# Patient Record
Sex: Female | Born: 1945
Health system: Southern US, Community
[De-identification: ages and names within clinical notes are randomized; demographics above are authoritative.]

## PROBLEM LIST (undated history)

## (undated) DIAGNOSIS — G4700 Insomnia, unspecified: Secondary | ICD-10-CM

## (undated) DIAGNOSIS — E0590 Thyrotoxicosis, unspecified without thyrotoxic crisis or storm: Secondary | ICD-10-CM

## (undated) DIAGNOSIS — F3289 Other specified depressive episodes: Secondary | ICD-10-CM

## (undated) DIAGNOSIS — G4730 Sleep apnea, unspecified: Secondary | ICD-10-CM

## (undated) DIAGNOSIS — T7840XA Allergy, unspecified, initial encounter: Secondary | ICD-10-CM

## (undated) DIAGNOSIS — D649 Anemia, unspecified: Secondary | ICD-10-CM

## (undated) DIAGNOSIS — E039 Hypothyroidism, unspecified: Secondary | ICD-10-CM

## (undated) DIAGNOSIS — M47814 Spondylosis without myelopathy or radiculopathy, thoracic region: Secondary | ICD-10-CM

## (undated) DIAGNOSIS — N3020 Other chronic cystitis without hematuria: Secondary | ICD-10-CM

## (undated) DIAGNOSIS — F410 Panic disorder [episodic paroxysmal anxiety] without agoraphobia: Secondary | ICD-10-CM

## (undated) DIAGNOSIS — I499 Cardiac arrhythmia, unspecified: Secondary | ICD-10-CM

## (undated) DIAGNOSIS — T8859XA Other complications of anesthesia, initial encounter: Secondary | ICD-10-CM

## (undated) DIAGNOSIS — F0390 Unspecified dementia without behavioral disturbance: Secondary | ICD-10-CM

## (undated) DIAGNOSIS — M1990 Unspecified osteoarthritis, unspecified site: Secondary | ICD-10-CM

## (undated) DIAGNOSIS — K219 Gastro-esophageal reflux disease without esophagitis: Secondary | ICD-10-CM

## (undated) DIAGNOSIS — F909 Attention-deficit hyperactivity disorder, unspecified type: Secondary | ICD-10-CM

## (undated) DIAGNOSIS — M26609 Unspecified temporomandibular joint disorder, unspecified side: Secondary | ICD-10-CM

## (undated) DIAGNOSIS — G709 Myoneural disorder, unspecified: Secondary | ICD-10-CM

## (undated) DIAGNOSIS — G4733 Obstructive sleep apnea (adult) (pediatric): Secondary | ICD-10-CM

## (undated) DIAGNOSIS — N200 Calculus of kidney: Secondary | ICD-10-CM

## (undated) DIAGNOSIS — E785 Hyperlipidemia, unspecified: Secondary | ICD-10-CM

## (undated) DIAGNOSIS — E119 Type 2 diabetes mellitus without complications: Secondary | ICD-10-CM

## (undated) DIAGNOSIS — T4145XA Adverse effect of unspecified anesthetic, initial encounter: Secondary | ICD-10-CM

## (undated) DIAGNOSIS — K222 Esophageal obstruction: Secondary | ICD-10-CM

## (undated) DIAGNOSIS — F4310 Post-traumatic stress disorder, unspecified: Secondary | ICD-10-CM

## (undated) DIAGNOSIS — F329 Major depressive disorder, single episode, unspecified: Secondary | ICD-10-CM

## (undated) DIAGNOSIS — N289 Disorder of kidney and ureter, unspecified: Secondary | ICD-10-CM

## (undated) HISTORY — DX: Major depressive disorder, single episode, unspecified: F32.9

## (undated) HISTORY — DX: Unspecified osteoarthritis, unspecified site: M19.90

## (undated) HISTORY — PX: TUBAL LIGATION: SHX77

## (undated) HISTORY — PX: CHOLECYSTECTOMY: SHX55

## (undated) HISTORY — DX: Attention-deficit hyperactivity disorder, unspecified type: F90.9

## (undated) HISTORY — DX: Post-traumatic stress disorder, unspecified: F43.10

## (undated) HISTORY — PX: TONSILLECTOMY: SUR1361

## (undated) HISTORY — PX: EYE SURGERY: SHX253

## (undated) HISTORY — DX: Hypothyroidism, unspecified: E03.9

## (undated) HISTORY — PX: APPENDECTOMY: SHX54

## (undated) HISTORY — DX: Myoneural disorder, unspecified: G70.9

## (undated) HISTORY — DX: Anemia, unspecified: D64.9

## (undated) HISTORY — DX: Spondylosis without myelopathy or radiculopathy, thoracic region: M47.814

## (undated) HISTORY — DX: Esophageal obstruction: K22.2

## (undated) HISTORY — DX: Other specified depressive episodes: F32.89

## (undated) HISTORY — PX: ABDOMINAL HYSTERECTOMY: SHX81

## (undated) HISTORY — DX: Insomnia, unspecified: G47.00

## (undated) HISTORY — DX: Panic disorder (episodic paroxysmal anxiety): F41.0

## (undated) HISTORY — DX: Other chronic cystitis without hematuria: N30.20

## (undated) HISTORY — DX: Thyrotoxicosis, unspecified without thyrotoxic crisis or storm: E05.90

## (undated) HISTORY — DX: Type 2 diabetes mellitus without complications: E11.9

## (undated) HISTORY — DX: Hyperlipidemia, unspecified: E78.5

## (undated) HISTORY — DX: Gastro-esophageal reflux disease without esophagitis: K21.9

## (undated) HISTORY — DX: Unspecified temporomandibular joint disorder, unspecified side: M26.609

## (undated) HISTORY — PX: COLONOSCOPY: SHX5424

## (undated) HISTORY — DX: Sleep apnea, unspecified: G47.30

## (undated) HISTORY — DX: Obstructive sleep apnea (adult) (pediatric): G47.33

## (undated) HISTORY — PX: CYSTORRHAPHY: SUR367

## (undated) HISTORY — DX: Allergy, unspecified, initial encounter: T78.40XA

## (undated) HISTORY — DX: Disorder of kidney and ureter, unspecified: N28.9

---

## 1997-05-26 ENCOUNTER — Ambulatory Visit (HOSPITAL_COMMUNITY): Admission: RE | Admit: 1997-05-26 | Discharge: 1997-05-26 | Payer: Self-pay | Admitting: Obstetrics & Gynecology

## 1997-12-01 ENCOUNTER — Ambulatory Visit (HOSPITAL_COMMUNITY): Admission: RE | Admit: 1997-12-01 | Discharge: 1997-12-01 | Payer: Self-pay | Admitting: Internal Medicine

## 1998-10-25 ENCOUNTER — Encounter: Admission: RE | Admit: 1998-10-25 | Discharge: 1998-11-03 | Payer: Self-pay | Admitting: *Deleted

## 1999-02-01 ENCOUNTER — Inpatient Hospital Stay (HOSPITAL_COMMUNITY): Admission: EM | Admit: 1999-02-01 | Discharge: 1999-02-07 | Payer: Self-pay | Admitting: *Deleted

## 1999-03-08 ENCOUNTER — Emergency Department (HOSPITAL_COMMUNITY): Admission: EM | Admit: 1999-03-08 | Discharge: 1999-03-08 | Payer: Self-pay | Admitting: Emergency Medicine

## 1999-10-22 ENCOUNTER — Encounter: Payer: Self-pay | Admitting: Obstetrics & Gynecology

## 1999-10-22 ENCOUNTER — Ambulatory Visit (HOSPITAL_COMMUNITY): Admission: RE | Admit: 1999-10-22 | Discharge: 1999-10-22 | Payer: Self-pay | Admitting: Obstetrics & Gynecology

## 1999-12-04 ENCOUNTER — Inpatient Hospital Stay (HOSPITAL_COMMUNITY): Admission: EM | Admit: 1999-12-04 | Discharge: 1999-12-11 | Payer: Self-pay | Admitting: Psychiatry

## 2000-06-06 ENCOUNTER — Encounter (INDEPENDENT_AMBULATORY_CARE_PROVIDER_SITE_OTHER): Payer: Self-pay

## 2000-06-06 ENCOUNTER — Other Ambulatory Visit: Admission: RE | Admit: 2000-06-06 | Discharge: 2000-06-06 | Payer: Self-pay | Admitting: Obstetrics & Gynecology

## 2001-08-03 ENCOUNTER — Other Ambulatory Visit: Admission: RE | Admit: 2001-08-03 | Discharge: 2001-08-03 | Payer: Self-pay | Admitting: Obstetrics & Gynecology

## 2001-10-21 ENCOUNTER — Encounter: Payer: Self-pay | Admitting: Family Medicine

## 2001-10-21 ENCOUNTER — Encounter: Admission: RE | Admit: 2001-10-21 | Discharge: 2001-10-21 | Payer: Self-pay | Admitting: Family Medicine

## 2001-11-23 ENCOUNTER — Ambulatory Visit (HOSPITAL_COMMUNITY): Admission: RE | Admit: 2001-11-23 | Discharge: 2001-11-23 | Payer: Self-pay | Admitting: Neurology

## 2001-11-23 ENCOUNTER — Encounter: Payer: Self-pay | Admitting: Neurology

## 2006-01-15 ENCOUNTER — Ambulatory Visit: Payer: Self-pay | Admitting: Internal Medicine

## 2006-01-23 ENCOUNTER — Ambulatory Visit: Payer: Self-pay | Admitting: Internal Medicine

## 2006-01-23 ENCOUNTER — Encounter (INDEPENDENT_AMBULATORY_CARE_PROVIDER_SITE_OTHER): Payer: Self-pay | Admitting: Specialist

## 2006-01-30 ENCOUNTER — Ambulatory Visit (HOSPITAL_COMMUNITY): Admission: RE | Admit: 2006-01-30 | Discharge: 2006-01-30 | Payer: Self-pay | Admitting: Internal Medicine

## 2006-01-30 ENCOUNTER — Encounter: Payer: Self-pay | Admitting: Internal Medicine

## 2007-04-27 ENCOUNTER — Ambulatory Visit: Payer: Self-pay | Admitting: Internal Medicine

## 2007-06-04 ENCOUNTER — Encounter: Payer: Self-pay | Admitting: Internal Medicine

## 2007-06-04 ENCOUNTER — Ambulatory Visit: Payer: Self-pay | Admitting: Internal Medicine

## 2008-11-22 DIAGNOSIS — F3289 Other specified depressive episodes: Secondary | ICD-10-CM | POA: Insufficient documentation

## 2008-11-22 DIAGNOSIS — K2990 Gastroduodenitis, unspecified, without bleeding: Secondary | ICD-10-CM

## 2008-11-22 DIAGNOSIS — F329 Major depressive disorder, single episode, unspecified: Secondary | ICD-10-CM | POA: Insufficient documentation

## 2008-11-22 DIAGNOSIS — K2970 Gastritis, unspecified, without bleeding: Secondary | ICD-10-CM | POA: Insufficient documentation

## 2008-11-22 DIAGNOSIS — Z8719 Personal history of other diseases of the digestive system: Secondary | ICD-10-CM | POA: Insufficient documentation

## 2008-11-22 DIAGNOSIS — K5289 Other specified noninfective gastroenteritis and colitis: Secondary | ICD-10-CM | POA: Insufficient documentation

## 2008-11-22 DIAGNOSIS — E039 Hypothyroidism, unspecified: Secondary | ICD-10-CM | POA: Insufficient documentation

## 2008-11-22 DIAGNOSIS — N39498 Other specified urinary incontinence: Secondary | ICD-10-CM | POA: Insufficient documentation

## 2008-11-22 DIAGNOSIS — R159 Full incontinence of feces: Secondary | ICD-10-CM | POA: Insufficient documentation

## 2009-05-03 ENCOUNTER — Telehealth: Payer: Self-pay | Admitting: Internal Medicine

## 2010-03-19 ENCOUNTER — Ambulatory Visit: Payer: Self-pay | Admitting: Internal Medicine

## 2010-04-28 ENCOUNTER — Emergency Department (HOSPITAL_BASED_OUTPATIENT_CLINIC_OR_DEPARTMENT_OTHER)
Admission: EM | Admit: 2010-04-28 | Discharge: 2010-04-29 | Payer: Self-pay | Source: Home / Self Care | Admitting: Emergency Medicine

## 2010-05-08 NOTE — Procedures (Signed)
Summary: COLON   Colonoscopy  Procedure date:  06/04/2007  Findings:      Location:  [facility].    Procedures Next Due Date:    Colonoscopy: 06/2017 Patient Name: Brianna, [name]. MRN:  Procedure Procedures: Colonoscopy CPT: [phone].    with biopsy. CPT: Q5068410.  Personnel: Endoscopist: Dora L. [name], MD.  Exam Location: Exam performed in Outpatient Clinic. Outpatient  Patient Consent: Procedure, Alternatives, Risks and Benefits discussed, consent obtained, from patient. Consent was obtained by the RN.  Indications Symptoms: Diarrhea Patient is incontinent of stool.  Surveillance of: 2003.  History  Current Medications: Patient is not currently taking Coumadin.  Pre-Exam Physical: Performed Jun 04, 2007. Cardio-pulmonary exam, HEENT exam , Abdominal exam, Extremity exam, Neurological exam, Mental status exam WNL.  Comments: Pt. history reviewed/updated, physical exam performed prior to initiation of sedation?yes Exam Exam: Extent of exam reached: Cecum, extent intended: Cecum.  The cecum was identified by appendiceal orifice and IC valve. Duration of exam: time in 7:28 m in, out 5:51 min minutes. Colon retroflexion performed. Images taken. ASA Classification: I. Tolerance: good.  Monitoring: Pulse and BP monitoring, Oximetry used. Supplemental O2 given.  Colon Prep Used Miralax for colon prep. Prep results: good.  Sedation Meds: Patient assessed and found to be appropriate for moderate (conscious) sedation. Fentanyl 75 mcg. given IV. Versed 9 mg. given IV. Benadryl 25 given IV.  Findings - DIAGNOSTIC TEST: Biopsies taken. from Hepatic Flexure to Descending Colon. Reason: r/o microscopic colitis.  - NORMAL EXAM: Cecum.  - OTHER FINDING: Rectum. Comments: markedly decrease rectal sphincter tone.   Assessment Normal examination.  Comments: no polyps or colitis, s/p random biopsies, incontinence likely due to  oversedation with antidepressants  and poor rectal sphincter tone Events  Unplanned Interventions: No intervention was required.  Unplanned Events: There were no complications. Plans Medication Plan: Await pathology. Anti-diarrheal: Loperamide 2 mg prn, starting Jun 04, 2007  Fiber supplements: Psyllium 1 Tbsp QD, starting Jun 04, 2007   Patient Education: Patient given standard instructions for: Patient instructed to get routine colonoscopy every 10 years.  Disposition: After procedure patient sent to recovery. After recovery patient sent home.   This report was created from the original endoscopy report, which was reviewed and signed by the above listed endoscopist.

## 2010-05-08 NOTE — Procedures (Signed)
Summary: EGD   EGD  Procedure date:  01/23/2006  Findings:      Location: [facility]   Patient Name: Brianna [name], Brianna [name]. MRN:  Procedure Procedures: Panendoscopy (EGD) CPT: 43235.    with biopsy(s)/brushing(s). CPT: D1846139.  Personnel: Endoscopist: Dora L. [name], MD.  Exam Location: Exam performed in Outpatient Clinic. Outpatient  Patient Consent: Procedure, Alternatives, Risks and Benefits discussed, consent obtained, from patient. Consent was obtained by the RN.  Indications Symptoms: Dysphagia.  Surveillance of: 2004.  Comments: dysphagia to solids and liquids, pt is on several psychotropic agents History  Current Medications: Patient is not currently taking Coumadin.  Pre-Exam Physical: Performed Jan 23, 2006  Cardio-pulmonary exam, HEENT exam, Abdominal exam, Extremity exam, Neurological exam, Mental status exam WNL.  Comments: Pt. history reviewed/updated, physical exam performed prior to initiation of sedation?yes Exam Exam Info: Maximum depth of insertion Duodenum, intended Duodenum. Vocal cords visualized. Gastric retroflexion performed. Images taken. ASA Classification: I. Tolerance: good.  Sedation Meds: Patient assessed and found to be appropriate for moderate (conscious) sedation. Fentanyl 50 mcg. given IV. Versed 5 mg. given IV. Cetacaine Spray 2 sprays given aerosolized.  Monitoring: BP and pulse monitoring done. Oximetry used. Supplemental O2 given  Findings - Normal: Distal Esophagus. Comments: no evidence of stricture.  - MUCOSAL ABNORMALITY: Body to Antrum. Erythematous mucosa. RUT done, results pending. ICD9: Gastritis, Acute: 535.00. Comment: moderate amount of bile in the stomach.  Normal: Duodenal Apex.   Assessment Abnormal examination, see findings above.  Diagnoses: 535.00: Gastritis, Acute.   Comments: bile reflux gastritis, no evidence of esophageal stricture, cause of the dysphagia probably due to esophageal  hypomotility secondary to  Clonazepam,Ritalin Trozadone and Tramadol Events  Unplanned Intervention: No unplanned interventions were required.  Unplanned Events: There were no complications. Plans Medication(s): Await pathology. PPI: Pantoprazole/Protonix 40 mg QAM, starting Jan 23, 2006   Comments: barium swallow with cineesoophagram to assess motility Disposition: After procedure patient sent to recovery. After recovery patient sent home.    FINAL DIAGNOSIS    ***MICROSCOPIC EXAMINATION AND DIAGNOSIS***    1. STOMACH: BENIGN GASTRIC MUCOSA. NO HELICOBACTER PYLORI,   INTESTINAL METAPLASIA OR ACTIVE INFLAMMATION IDENTIFIED.    2. STOMACH, POLYPS: BENIGN FUNDIC GLAND POLYPS.    COMMENT   1. A Warthin-Starry stain is performed to determine the   possibility of the presence of Helicobacter pylori. The   Warthin-Starry stain is negative for organisms of Helicobacter   pylori. The control(s) stained appropriately.    2. A Warthin-Starry stain is performed to determine the   possibility of the presence of Helicobacter pylori. The   Warthin-Starry stain is negative for organisms of Helicobacter   pylori. The control(s) stained appropriately.   (EAA:mw 01/24/06)    mw   Date Reported: 01/24/2006 Alden Server A. Delila Spence, MD   *** Electronically Signed Out By EAA ***    Clinical information   Dysphagia Gastritis / gastric polyp (jes)    specimen(s) obtained   1: Stomach, biopsy, antrum   2: Stomach, polyp(s)    Gross Description   1. Received in formalin are tan, soft tissue fragments that are   submitted in toto. Number: multiple.   Size: 0.2 to 0.3 cm One block    2. Received in formalin are tan, soft tissue fragments that are   submitted in toto. Number: multiple.   Size: 0.3 to 0.4 cm One block (TA:jes, 01/24/06)    jes/  This report was created from the original endoscopy report, which was  reviewed and signed by the above listed endoscopist.

## 2010-05-08 NOTE — Progress Notes (Signed)
Summary: Triage   Phone Note Call from Patient Call back at [phone]   Caller: Patient Call For: Dr. [name] Reason for Call: Talk to Nurse Summary of Call: Pt said she is "impacted" and Dr. Riley Nearing is suggesting she get a CT Scan Initial call taken by: Karna Christmas,  May 03, 2009 2:47 PM  Follow-up for Phone Call        Pt. c/o stool impaction for 3 monthes. "I have taken all kinds of cleansers and suppositories, everything. I am not getting any relief"  Pt. PCP recommends a GI consult. Pt. will see Willette Cluster NP on 05-04-09 at 1:30pm. Pt. advised of med.list/co-pay/cx.policy. Pt. will have PCP fax records. Pt. instructed to call back as needed.  Follow-up by: Laureen Ochs LPN,  May 03, 2009 3:01 PM

## 2010-08-21 NOTE — Assessment & Plan Note (Signed)
[facility]                         GASTROENTEROLOGY OFFICE NOTE   NAME:Brianna [name], Brianna [name]                     MRN:          [id number]  DATE:04/27/2007                            DOB:          18-Aug-1945    Brianna [name] is a 65 year old white female with a history of endogenous  depression treated by Dr. Ladona Ridgel.  She is on multiple psychotropic  medications.  We saw her last year because of dysphagia, which was  attributed to decreased esophageal motility due to anticholinergic  effect of her medications.  Upper endoscopy at that time showed mild  gastritis.  She no longer has a problem with dysphagia, but now is  complaining of intermittent fecal incontinence.  Every 2 to 3 weeks, she  may find that she had a bowel movement, either a small amount of leakage  or even medium-sized stool in her underwear without ever knowing that  she had to go.  In between these episodes, her bowel movements seem to  be regular having 1 or 2 bowel movements a day.  She denies any rectal  bleeding.  Colonoscopy in May 2003 showed resolving acute colitis.  The  patient, at that time, was evaluated for severe abdominal pain,  suspicious for ischemic colitis.   PAST MEDICAL HISTORY:  Also significant for bladder suspension in 1984  and pancreatitis in 1984 as well.   MEDICATIONS:  1. Abilify 15 mg p.o. daily.  2. Ritalin 20 mg p.o. daily.  3. Clonazepam 0.5 mg 1 q. a.m. and 2 nightly.  4. Lexapro 20 mg daily.   PHYSICAL EXAM:  Blood pressure 96/64, pulse 96, weight 178 pounds.  The patient had a flat affect.  She was somewhat sedated, but she  responded to my questions.  Oral cavity was normal.  NECK:  Supple.  No adenopathy.  LUNGS:  Clear to auscultation.  COR:  Normal S1, normal S2.  ABDOMEN:  Soft, nontender, with normoactive bowel sounds.  There was no  fullness and no distension.  RECTAL:  Shows somewhat decreased rectal tone with very weak squeeze.  There was  small amount of brown Hemoccult negative stool in the rectal  ampulla.   IMPRESSION:  A 65 year old white female with intermittent fecal  incontinence and decreased rectal sphincter tone.  She also has urinary  stress incontinence, which suggests the possibility of colorectal  relaxation causing rectocele and cystocele.  She has no sensation of  urge to have a bowel movement, which may be caused by multiple  psychotropic medications.  She is chronically sedated and may miss the  signal to go to the bathroom.  She has a history of questionable  colitis, although with assume this was ischemic colitis, having a low  grade inflammatory bowel disease would be another possibility.   PLAN:  1. Increase fiber in her diet.  Samples of Benefiber 1 package a day.  2. Colonoscopy scheduled.  3. I suggested regular toilet training, which would consist of going      to the bathroom after each meal, and trying to sit and have a bowel  movement if possible.  If the exam shows that she may have a      rectocele/cystocele, she may need further evaluation by      gynecologist or urologist for surgical repair.     Brianna [name]. [name], MD  Electronically Signed    DMB/MedQ  DD: 04/27/2007  DT: 04/27/2007  Job #: [id number]   cc:   Dr. Kerry Hough  Dr. Bernadette Hoit

## 2010-08-24 NOTE — H&P (Signed)
[facility]  Patient:    Brianna [name], Brianna [name]                         MRN: [id number] Adm. Date:  [id number] Disc. Date: [id number] Attending:  Fortunato Curling                         History and Physical  HISTORY OF PRESENT ILLNESS:  Brianna [name] is a 65 year old married white female who was admitted on a voluntary basis.  The patient has a long history of recurrent major depression, who overdosed on approximately 10-15 Klonopin while acutely suicidal.  She intended to continue taking her pills, but she fell asleep and her husband found her later that afternoon.  He brought her to my office, where she could not contract for safety.  I referred the patient here for admission to [facility] on a voluntary basis.  PAST PSYCHIATRIC HISTORY:  Past psychiatric history is extensive.  The patient has been under my psychiatric care for the past six years.  She has had multiple prior hospitalizations, the last of which was in 1999.  SOCIAL HISTORY:  The patient is married.  Her husband is supportive.  She is on social security disability.  She is taking some college courses in religion.  FAMILY HISTORY:  Unremarkable.  ALCOHOL OR DRUG HISTORY:  None.  PAST MEDICAL HISTORY:  The patient has a history  of hypothyroidism.  She has had a negative cardiac workup about three or four months ago.  MEDICATIONS:  Effexor SR 225 mg per day, Risperdal 1 mg q.h.s., Klonopin 0.5 mg q.a.m. and 1 mg q.h.s., and Synthroid.  ALLERGIES:  PENICILLIN and TETRACYCLINE.  REVIEW OF SYSTEMS:  She denies any recent changes in function of ears, eyes, nose, or throat.  She reports that she has had a gradual 10-20 pound weight gain over the past six months.  She denies changes of function of bowel or bladder.  She denies chest pain or shortness of breath.  MENTAL STATUS EXAMINATION:  Mental status exam on admission found the patient to be a disheleved white female with decreased eye  contact.  She is somewhat psychomotor-retarded.  Her speech is soft, fluent, and goal-directed.  Her mood is dysphoric and hopeless.  She has a blunted affect.  She has no auditory or visual hallucinations.  She is alert and oriented x 4.  She endorses suicidal ideation.  She cannot contract for safety out of the hospital at the time of admission.  PHYSICAL EXAMINATION:  GENERAL:  A medium-build white female.  VITAL SIGNS:  Temperature is 98.0, pulse 115, respirations 20, height 5 feet 2 inches, and she weighs 175 pounds.  HEENT:  Pupils are equal, round, and reactive to light.  Optic discs are flat without papilledema.  Tympanic membranes are clear bilaterally.  Posterior pharynx was clear without lesion.  NECK:  Supple with no thyromegaly.  HEART:  Regular rhythm and rate with a normal S1 and S2.  ABDOMEN:  Soft and nontender.  NEUROLOGIC:  Her cranial nerves II-XII are grossly intact.  Deep tendon reflexes are 1+ throughout and symmetrical.  She has no ataxia.  ADMITTING DIAGNOSES: Axis I:    Major depression, severe, recurrent. Axis II:   Borderline personality traits. Axis III:  Hypothyroidism. Axis IV:   Stressors are moderate. Axis V:    Current level of functioning is 30; highest in the past year is  50.  PRELIMINARY TREATMENT PLAN:  We will admit the patient and will augment her antidepressant medication with a trial of lithium, both for its antidepressant, mood-stabilizing, and anti-suicide effects.  We will continue her other medications for now.  We will place her on level 2 and 15-minute checks.  We will provide a daily reality and supportive psychotherapies.  ESTIMATED LENGTH OF HOSPITALIZATION:  Three to five days. DD:  12/23/99 TD:  12/24/99 Job: [id number]

## 2010-08-24 NOTE — Discharge Summary (Signed)
[facility]  Patient:    Brianna [name], Brianna [name]                         MRN: [id number] Adm. Date:  [id number] Disc. Date: [id number] Attending:  Fortunato Curling                           Discharge Summary  HISTORY:  Patient is a 65 year old married white female with a long history of recurrent major depression who was admitted after she had overdosed on between 86 and 64 Klonopin and continuing to express suicidal ideation.  She was admitted on a voluntary basis.  LABORATORY DATA:  Chemistry panel was unremarkable.  Thyroid functions were all within normal limits.  HOSPITAL COURSE:  The patient was admitted to the [facility] under the care of Dr. Elna Breslow.  She was initially placed on 15 minutes checks and therapeutic level II to ensure her safety.  The patient was started on a trial of lithium to help augment her antidepressant response.  She tolerated this well and was gradually increased.  On December 07, 1999, the patient seemed to bottom out suddenly for no apparent cause.  At that time, she was having intrusive suicidal thoughts with a lot of anxiety.  Over the weekend, the patient was seen by Dr. Dub Mikes who reported that she continued to have a lot of depression and he increased her Effexor.  She remained depressed and expressing hopelessness.  When I reevaluated the patient, on December 11, 1999, she was doing much better.  Her mood was more upbeat and her affect was brighter.  She denied any suicidal ideation.  She was tolerating the lithium well.  I reviewed the case with her husband and we agreed that she was well stabilized so that she could be discharged home.  Husband agreed that the patient was improved.  DISCHARGE MEDICATIONS: 1. Lithobid 300 mg 1/2 pill b.i.d. 2. Effexor XR 150 mg 2 q.d. 3. Risperdal 1 mg q.h.s. 4. Klonopin 1 mg p.r.n. 5. Synthroid.  FOLLOW-UP:  Dr. Elna Breslow in 10-14 days.  DISCHARGE  DIAGNOSES: Axis I:    Major depression, severe, recurrent without psychotic features. Axis II:   Borderline personality traits. Axis III:  Hypothyroidism. Axis IV:   Stressors are moderate. Axis V:    Current level of functioning is 55; highest in past year 60. DD:  12/23/99 TD:  12/26/99 Job: [id number]

## 2010-08-24 NOTE — Assessment & Plan Note (Signed)
[facility]                           GASTROENTEROLOGY OFFICE NOTE   NAME:Brianna [name], Brianna [name]                     MRN:          [id number]  DATE:01/15/2006                            DOB:          12-11-1945    Brianna [name] is a very nice 65 year old white female with severe depression  being treated by Dr. Ladona Ridgel with antidepressants, Klonopin 1 mg three times  a day, and trazodone 100 mg at bedtime. She is here today because of  difficulty swallowing more predominantly solids but also some liquids. This  has been going on for several months. Food gets stuck or sour liquids come  back. She denies any odynophagia. Her weight has actually increased about 30  pounds in the last year. I have seen her in the past for symptoms of  infectious colitis and diarrhea in May 2003. There was no clinical evidence  for inflammatory bowel disease. She also had an upper endoscopy on Sep 01, 2001 for evaluation of nausea, anorexia, abdominal pain, but exam was normal  and her CLOtest was negative.   MEDICATIONS:  1. Ditropan XL 10 mg daily.  2. Clonazepam  3. Lipitor 10 mg q.h.s.  4. Ritalin 5 mg b.i.d.  5. Synthroid.  6. Metamucil.  7. Trazodone.  8. Tramadol 50 mg p.r.n.   PHYSICAL EXAMINATION:  VITAL SIGNS:  Blood pressure 110/70, pulse 120,  weight 178 pounds.  GENERAL:  The patient was very slow responding, lethargic, but oriented. She  was overweight.  LUNGS:  Clear to auscultation.  COR:  Normal S1, normal S2.  ABDOMEN:  Soft, obese without tenderness. Normoactive bowel sounds. Liver  edge is close to margin.  RECTAL:  Not done.   IMPRESSION:  A 65 year old white female with recent onset of dysphagia  predominantly to solids but also to liquids. It retraces to the possibility  of esophageal dysmotility or possibly infectious esophagitis. Her  psychotropic agents may be contributing to the esophageal hypermotility. Her  upper endoscopy four years ago  did not show any evidence of stricture, but  she has since then had intermittent gastroesophageal reflux which has not  been treated and which could have caused development of esophageal  stricture.   PLAN:  1. Upper endoscopy scheduled.  2. Samples of Protonix given 40 mg p.o. daily until she can be      endoscopied. Then will decide about further evaluation.       [name]. [name], MD      DMB/MedQ  DD:  01/15/2006  DT:  01/17/2006  Job #:  [id number]   cc:   Bernadette Hoit, M.D.

## 2011-02-08 ENCOUNTER — Inpatient Hospital Stay (HOSPITAL_COMMUNITY)
Admission: RE | Admit: 2011-02-08 | Discharge: 2011-02-09 | DRG: 885 | Disposition: A | Discharge status: Home / Self Care | Payer: Medicare Other | Source: Ambulatory Visit | Attending: Psychiatry | Admitting: Psychiatry

## 2011-02-08 DIAGNOSIS — G43909 Migraine, unspecified, not intractable, without status migrainosus: Secondary | ICD-10-CM

## 2011-02-08 DIAGNOSIS — Z880 Allergy status to penicillin: Secondary | ICD-10-CM

## 2011-02-08 DIAGNOSIS — E039 Hypothyroidism, unspecified: Secondary | ICD-10-CM

## 2011-02-08 DIAGNOSIS — F411 Generalized anxiety disorder: Secondary | ICD-10-CM

## 2011-02-08 DIAGNOSIS — G8929 Other chronic pain: Secondary | ICD-10-CM

## 2011-02-08 DIAGNOSIS — Z6379 Other stressful life events affecting family and household: Secondary | ICD-10-CM

## 2011-02-08 DIAGNOSIS — F329 Major depressive disorder, single episode, unspecified: Principal | ICD-10-CM

## 2011-02-09 DIAGNOSIS — F39 Unspecified mood [affective] disorder: Secondary | ICD-10-CM

## 2011-02-09 LAB — T3, FREE: T3, Free: 2.5 pg/mL (ref 2.3–4.2)

## 2011-02-09 LAB — T4, FREE: Free T4: 1.04 ng/dL (ref 0.80–1.80)

## 2011-02-09 MED ORDER — ACETAMINOPHEN 325 MG PO TABS
650.0000 mg | ORAL_TABLET | Freq: Four times a day (QID) | ORAL | Status: DC | PRN
Start: 2011-02-09 — End: 2011-02-09

## 2011-02-09 MED ORDER — MAGNESIUM HYDROXIDE 400 MG/5ML PO SUSP: 30.0000 mL | Freq: Every day | ORAL | Status: DC | PRN

## 2011-02-09 MED ORDER — ALUM & MAG HYDROXIDE-SIMETH 200-200-20 MG/5ML PO SUSP: 30.0000 mL | ORAL | Status: DC | PRN

## 2011-02-09 MED ORDER — FOLIC ACID 1 MG PO TABS
1.0000 mg | ORAL_TABLET | Freq: Every day | ORAL | Status: DC
  Filled 2011-02-09 (×2): qty 1

## 2011-02-09 MED ORDER — ASPIRIN EC 81 MG PO TBEC: 81.0000 mg | DELAYED_RELEASE_TABLET | Freq: Every day | ORAL | Status: DC

## 2011-02-09 MED ORDER — LEVOTHYROXINE SODIUM 50 MCG PO TABS: 50.0000 ug | ORAL_TABLET | Freq: Every day | ORAL | Status: DC

## 2011-02-09 MED ORDER — QUETIAPINE FUMARATE 200 MG PO TABS: 200.0000 mg | ORAL_TABLET | Freq: Every day | ORAL | Status: DC

## 2011-02-09 MED ORDER — MEMANTINE HCL 10 MG PO TABS
10.0000 mg | ORAL_TABLET | Freq: Two times a day (BID) | ORAL | Status: DC
  Filled 2011-02-09: qty 1

## 2011-02-09 MED ORDER — SIMVASTATIN 40 MG PO TABS: 40.0000 mg | ORAL_TABLET | Freq: Every day | ORAL | Status: DC

## 2011-02-09 MED ORDER — DESVENLAFAXINE SUCCINATE ER 50 MG PO TB24: 50.0000 mg | ORAL_TABLET | Freq: Every day | ORAL | Status: DC
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
## 2011-03-22 ENCOUNTER — Ambulatory Visit (INDEPENDENT_AMBULATORY_CARE_PROVIDER_SITE_OTHER): Payer: Medicare Other | Admitting: Internal Medicine

## 2011-03-22 ENCOUNTER — Encounter: Payer: Self-pay | Admitting: Internal Medicine

## 2011-03-22 VITALS — BP 112/72 | HR 68 | Ht 59.0 in | Wt 164.8 lb

## 2011-03-22 DIAGNOSIS — K589 Irritable bowel syndrome without diarrhea: Secondary | ICD-10-CM

## 2011-03-22 DIAGNOSIS — R1013 Epigastric pain: Secondary | ICD-10-CM

## 2011-03-22 DIAGNOSIS — K3189 Other diseases of stomach and duodenum: Secondary | ICD-10-CM

## 2011-03-22 NOTE — Patient Instructions (Signed)
You have been scheduled for an endoscopy. Please follow written instructions given to you at your visit today. CC: Dr Riley Nearing

## 2011-03-22 NOTE — Progress Notes (Signed)
Brianna [name] 1945/06/15 MRN [id number]   History of Present Illness:  This is a 65 year old white female with chronic gastrointestinal problems that have been more persistent recently. She comes with her husband who complains of the patient being sick all of the time. Patient describes fullness and bloating postprandially as well as crampy abdominal pain. She denies constipation or diarrhea. She takes Zofran for nausea. We have seen her in the past for an upper endoscopy in 2004 and again in 2007 which showed bile reflux esophagitis. A barium esophagram in October 2007 showed a minimal hiatal hernia with moderate reflux. Her upper endoscopy in 2003 and again in February 2009 for fecal incontinence showed a decreased rectal sphincter tone. Her biopsy showed no evidence of microscopic colitis. She used to be on multiple psychotropic medications for depression. She is currently on pristiq 50 mg a day.   Past Medical History  Diagnosis Date  . Attention deficit disorder with hyperactivity   . Other chronic cystitis   . Posttraumatic stress disorder     followed by Dr Ladona Ridgel, psychiatry  . Depressive disorder, not elsewhere classified   . Esophageal reflux   . Stricture and stenosis of esophagus   . Other and unspecified hyperlipidemia   . Unspecified hypothyroidism   . Insomnia   . IBS (irritable bowel syndrome)   . Osteoarthritis   . Migraine   . Obstructive sleep apnea   . Temporomandibular joint disorders, unspecified   . Thoracic spondylosis without myelopathy   . Acute colitis   . Panic attacks    Past Surgical History  Procedure Date  . Appendectomy   . Cholecystectomy   . Tubal ligation   . Bladder surgery   . Eye surgery   . Abdominal hysterectomy   . Shoulder surgery   . Tonsillectomy     reports that she has quit smoking. She has never used smokeless tobacco. She reports that she does not drink alcohol or use illicit drugs. family history includes COPD in her  mother; Crohn's disease in her son; Depression in her mother; Diabetes in her mother; Heart attack in her father; Heart failure in her father; and Liver disease in her father. Allergies  Allergen Reactions  . Tetracycline         Review of Systems: He denies dysphagia chest pain shortness of breath  The remainder of the 10 point ROS is negative except as outlined in H&P   Physical Exam: General appearance  Well developed, in no distress. Appears depressed Eyes- non icteric. HEENT nontraumatic, normocephalic. Mouth no lesions, tongue papillated, no cheilosis. Neck supple without adenopathy, thyroid not enlarged, no carotid bruits, no JVD. Lungs Clear to auscultation bilaterally. Cor normal S1, normal S2, regular rhythm, no murmur,  quiet precordium. Abdomen: Soft minimally tender right lower and middle quadrant. Normoactive bowel sounds. Rectal: Soft Hemoccult negative stool Extremities no pedal edema. Skin no lesions. Neurological alert and oriented x 3. Psychological normal mood and affect.  Assessment and Plan:  Problem #1 Chronic bloating and abdominal pain with recent exacerbation. Patient has a history of bile reflux gastritis as well as irritable bowel syndrome. We will proceed with an upper endoscopy and start her on Nexium 40 mg daily as well as Levsin sublingual 0.125 mg every 4 hours when necessary for bloating and nausea. She may continue her Zofran. I have talked to her about the possibility of depression and stress which may be causing the problem. She has problems which seem to be causing a  lot of stress.     03/22/2011 [name]

## 2011-03-25 ENCOUNTER — Encounter: Payer: Self-pay | Admitting: Internal Medicine

## 2011-03-25 ENCOUNTER — Ambulatory Visit (AMBULATORY_SURGERY_CENTER): Payer: Medicare Other | Admitting: Internal Medicine

## 2011-03-25 VITALS — BP 134/63 | HR 86 | Temp 98.1°F | Resp 12 | Ht 59.0 in | Wt 164.0 lb

## 2011-03-25 DIAGNOSIS — R1013 Epigastric pain: Secondary | ICD-10-CM

## 2011-03-25 DIAGNOSIS — K2990 Gastroduodenitis, unspecified, without bleeding: Secondary | ICD-10-CM

## 2011-03-25 DIAGNOSIS — K2970 Gastritis, unspecified, without bleeding: Secondary | ICD-10-CM

## 2011-03-25 DIAGNOSIS — K3189 Other diseases of stomach and duodenum: Secondary | ICD-10-CM

## 2011-03-25 DIAGNOSIS — K253 Acute gastric ulcer without hemorrhage or perforation: Secondary | ICD-10-CM

## 2011-03-25 DIAGNOSIS — K2940 Chronic atrophic gastritis without bleeding: Secondary | ICD-10-CM

## 2011-03-25 MED ORDER — ESOMEPRAZOLE MAGNESIUM 40 MG PO CPDR: 40.0000 mg | DELAYED_RELEASE_CAPSULE | Freq: Every day | ORAL | Status: DC

## 2011-03-25 MED ORDER — SODIUM CHLORIDE 0.9 % IV SOLN: 500.0000 mL | INTRAVENOUS | Status: DC

## 2011-03-25 NOTE — Patient Instructions (Addendum)
Please refer to your blue and neon green sheets for instructions regarding diet and activity for the rest of today.  You may resume your medications as you would normally take them.   New prescription for Nexium given.

## 2011-03-25 NOTE — Op Note (Signed)
[facility] [address] [place], [place]  [address]  ENDOSCOPY PROCEDURE REPORT  PATIENT:  [name], Brianna  MR#:  [id number] BIRTHDATE:  [birth year], 65 yrs. old  GENDER:  female  ENDOSCOPIST:  [name]. [name], MD Referred by:  Bernadette Hoit, M.D.  PROCEDURE DATE:  03/25/2011 PROCEDURE:  EGD with biopsy, 43239 ASA CLASS:  Class II INDICATIONS:  nausea, abdominal pain hx of bile reflux gastritis on EGD 2004 and 2007 has been on Ibuprofen prn  MEDICATIONS:   These medications were titrated to patient response per physician's verbal order, Versed 6 mg, Fentanyl 50 mcg TOPICAL ANESTHETIC:  Cetacaine Spray  DESCRIPTION OF PROCEDURE:   After the risks benefits and alternatives of the procedure were thoroughly explained, informed consent was obtained.  The LB GIF-H180 [id number] endoscope was introduced through the mouth and advanced to the second portion of the duodenum, without limitations.  The instrument was slowly withdrawn as the mucosa was fully examined. <<PROCEDUREIMAGES>>  Multiple ulcers were found. 3 tiny gastric ulcers 3 mm in the antrum, no stigmata of bleeding With standard forceps, a biopsy was obtained and sent to pathology (see image6, image5, and image7).  Otherwise the examination was normal (see image8, image1, image2, image3, image9, and image10).  Otherwise the examination was normal.    Retroflexed views revealed no abnormalities.    The scope was then withdrawn from the patient and the procedure completed.  COMPLICATIONS:  None  ENDOSCOPIC IMPRESSION: 1) Ulcers, multiple 2) Otherwise normal examination suspect Ibuprofen induced ulcers, no significant bile reflux RECOMMENDATIONS: 1) Await biopsy results PPI Nexiem 40 mg po qd stop taking Ibuprofen  REPEAT EXAM:  In 0 year(s) for.  ______________________________ [name]. [name], MD  CC:  n. eSIGNED:   [name]. Trejan Buda at 03/25/2011 09:08 AM  Tama Headings, [id number]

## 2011-03-25 NOTE — Progress Notes (Signed)
Patient did not experience any of the following events: a burn prior to discharge; a fall within the facility; wrong site/side/patient/procedure/implant event; or a hospital transfer or hospital admission upon discharge from the facility. (G8907) Patient did not have preoperative order for IV antibiotic SSI prophylaxis. (G8918)  

## 2011-03-26 ENCOUNTER — Telehealth: Payer: Self-pay | Admitting: *Deleted

## 2011-03-26 NOTE — Telephone Encounter (Signed)

## 2011-03-28 ENCOUNTER — Encounter: Payer: Self-pay | Admitting: Internal Medicine

## 2011-03-28 ENCOUNTER — Telehealth: Payer: Self-pay | Admitting: Internal Medicine

## 2011-03-28 NOTE — Telephone Encounter (Signed)
Please send Levsin SL.125 mg, #30 1 SL q 4 hrs prn abd. Pain/spasm

## 2011-03-28 NOTE — Telephone Encounter (Signed)
Patient states Dr. [name] told her she was going to order a medication "to go under my tongue." Please, advise.

## 2011-03-29 ENCOUNTER — Telehealth: Payer: Self-pay | Admitting: Internal Medicine

## 2011-03-29 MED ORDER — HYOSCYAMINE SULFATE 0.125 MG SL SUBL: SUBLINGUAL_TABLET | SUBLINGUAL | Status: DC

## 2011-03-29 NOTE — Telephone Encounter (Signed)
Rx sent. Patient aware.  

## 2011-04-10 ENCOUNTER — Ambulatory Visit: Payer: Medicare Other | Admitting: Internal Medicine

## 2011-10-21 ENCOUNTER — Emergency Department (HOSPITAL_BASED_OUTPATIENT_CLINIC_OR_DEPARTMENT_OTHER): Payer: Medicare Other

## 2011-10-21 ENCOUNTER — Emergency Department (HOSPITAL_BASED_OUTPATIENT_CLINIC_OR_DEPARTMENT_OTHER)
Admission: EM | Admit: 2011-10-21 | Discharge: 2011-10-21 | Disposition: A | Discharge status: Home / Self Care | Payer: Medicare Other | Attending: Emergency Medicine | Admitting: Emergency Medicine

## 2011-10-21 ENCOUNTER — Encounter (HOSPITAL_BASED_OUTPATIENT_CLINIC_OR_DEPARTMENT_OTHER): Payer: Self-pay | Admitting: Family Medicine

## 2011-10-21 DIAGNOSIS — E119 Type 2 diabetes mellitus without complications: Secondary | ICD-10-CM | POA: Insufficient documentation

## 2011-10-21 DIAGNOSIS — Z7982 Long term (current) use of aspirin: Secondary | ICD-10-CM | POA: Insufficient documentation

## 2011-10-21 DIAGNOSIS — M810 Age-related osteoporosis without current pathological fracture: Secondary | ICD-10-CM | POA: Insufficient documentation

## 2011-10-21 DIAGNOSIS — E039 Hypothyroidism, unspecified: Secondary | ICD-10-CM | POA: Insufficient documentation

## 2011-10-21 DIAGNOSIS — F329 Major depressive disorder, single episode, unspecified: Secondary | ICD-10-CM | POA: Insufficient documentation

## 2011-10-21 DIAGNOSIS — Z79899 Other long term (current) drug therapy: Secondary | ICD-10-CM | POA: Insufficient documentation

## 2011-10-21 DIAGNOSIS — Z9089 Acquired absence of other organs: Secondary | ICD-10-CM | POA: Insufficient documentation

## 2011-10-21 DIAGNOSIS — F3289 Other specified depressive episodes: Secondary | ICD-10-CM | POA: Insufficient documentation

## 2011-10-21 DIAGNOSIS — K589 Irritable bowel syndrome without diarrhea: Secondary | ICD-10-CM | POA: Insufficient documentation

## 2011-10-21 DIAGNOSIS — G43909 Migraine, unspecified, not intractable, without status migrainosus: Secondary | ICD-10-CM | POA: Insufficient documentation

## 2011-10-21 DIAGNOSIS — E785 Hyperlipidemia, unspecified: Secondary | ICD-10-CM | POA: Insufficient documentation

## 2011-10-21 DIAGNOSIS — F909 Attention-deficit hyperactivity disorder, unspecified type: Secondary | ICD-10-CM | POA: Insufficient documentation

## 2011-10-21 MED ORDER — METHYLPREDNISOLONE SODIUM SUCC 125 MG IJ SOLR
125.0000 mg | Freq: Once | INTRAMUSCULAR | Status: AC
  Administered 2011-10-21: 125 mg via INTRAVENOUS
  Filled 2011-10-21: qty 2

## 2011-10-21 MED ORDER — PROMETHAZINE HCL 25 MG PO TABS
25.0000 mg | ORAL_TABLET | Freq: Once | ORAL | Status: AC
  Administered 2011-10-21: 25 mg via ORAL
  Filled 2011-10-21: qty 1

## 2011-10-21 MED ORDER — HYDROXYZINE HCL 10 MG PO TABS
20.0000 mg | ORAL_TABLET | Freq: Once | ORAL | Status: DC
  Filled 2011-10-21: qty 2

## 2011-10-21 MED ORDER — HYDROXYZINE HCL 25 MG PO TABS
ORAL_TABLET | ORAL | Status: AC
  Filled 2011-10-21: qty 1

## 2011-10-21 MED ORDER — DROPERIDOL 2.5 MG/ML IJ SOLN
0.6250 mg | Freq: Once | INTRAMUSCULAR | Status: AC
  Administered 2011-10-21: 0.625 mg via INTRAVENOUS
  Filled 2011-10-21: qty 2

## 2011-10-21 MED ORDER — DIPHENHYDRAMINE HCL 25 MG PO CAPS
25.0000 mg | ORAL_CAPSULE | Freq: Once | ORAL | Status: AC
  Administered 2011-10-21: 25 mg via ORAL
  Filled 2011-10-21: qty 1

## 2011-10-21 MED ORDER — SODIUM CHLORIDE 0.9 % IV BOLUS (SEPSIS)
1000.0000 mL | Freq: Once | INTRAVENOUS | Status: AC
  Administered 2011-10-21: 1000 mL via INTRAVENOUS

## 2011-10-21 MED ORDER — KETOROLAC TROMETHAMINE 30 MG/ML IJ SOLN
30.0000 mg | Freq: Once | INTRAMUSCULAR | Status: AC
  Administered 2011-10-21: 30 mg via INTRAVENOUS
  Filled 2011-10-21: qty 1

## 2011-10-21 MED ORDER — HYDROXYZINE HCL 25 MG PO TABS
25.0000 mg | ORAL_TABLET | Freq: Once | ORAL | Status: AC
  Administered 2011-10-21: 25 mg via ORAL

## 2011-10-21 NOTE — ED Notes (Signed)
Patient transported to CT 

## 2011-10-21 NOTE — ED Notes (Signed)
Pt c/o migraine since waking up this morning. Pt denies n/v. Pt sts she also feels "dizzy". Pt takes Imitrex for same but did not take med this morning.

## 2011-10-21 NOTE — ED Provider Notes (Signed)
History     CSN: [id number]  Arrival date & time 10/21/11  0944   First MD Initiated Contact with Patient 10/21/11 1040    room 10  Chief Complaint  Patient presents with  . Migraine    (Consider location/radiation/quality/duration/timing/severity/associated sxs/prior treatment) HPI Patient complaining of severe migraine.  Patient has had migraines for 40 years but worse in past three years.  Neurologist is Dr. Maple Hudson for 2 years.  Headache began in front radiating to bilateral eyes today on awakening at 0830.  Patient did not take any medication for this.  Patient states she was off balance and had to hold on to wall to walk.  Patient states she is having a hard time processing.  She states she has had balance issues but has not had problems with "processing" before.  Patient with nausea and light and noise irritability c.w. Usual migraine.  Patient denies lateralized weakness.  Patient states she had low energy yesterday.  Denies fever, chills, vomiting, diarrhea.  Patient with cough for 4-5 days, nonproductive, states draining in back of throat. PMD is Dr. Paulino Door.  Past Medical History  Diagnosis Date  . Attention deficit disorder with hyperactivity   . Other chronic cystitis   . Posttraumatic stress disorder     followed by Dr Ladona Ridgel, psychiatry  . Depressive disorder, not elsewhere classified   . Esophageal reflux   . Stricture and stenosis of esophagus   . Other and unspecified hyperlipidemia   . Unspecified hypothyroidism   . Insomnia   . IBS (irritable bowel syndrome)   . Osteoarthritis   . Migraine   . Obstructive sleep apnea   . Temporomandibular joint disorders, unspecified   . Thoracic spondylosis without myelopathy   . Acute colitis   . Panic attacks   . Diabetes mellitus     diet controlled  . Osteoporosis     Past Surgical History  Procedure Date  . Appendectomy   . Cholecystectomy   . Tubal ligation   . Bladder surgery   . Eye surgery   . Abdominal  hysterectomy   . Tonsillectomy     Family History  Problem Relation Age of Onset  . Heart attack Father   . Heart failure Father   . Liver disease Father   . Heart disease Father   . COPD Mother   . Depression Mother   . Diabetes Mother   . Crohn's disease Son     History  Substance Use Topics  . Smoking status: Former Games developer  . Smokeless tobacco: Never Used  . Alcohol Use: No    OB History    Grav Para Term Preterm Abortions TAB SAB Ect Mult Living                  Review of Systems  Allergies  Tetracycline  Home Medications   Current Outpatient Rx  Name Route Sig Dispense Refill  . FOLIC ACID 1 MG PO TABS Oral Take 1 mg by mouth daily.    . SUMATRIPTAN SUCCINATE 100 MG PO TABS Oral Take 100 mg by mouth every 2 (two) hours as needed.      . N-ACETYL-L-CYSTEINE 600 MG PO CAPS Oral Take 1 tablet by mouth daily.      . AMOXICILLIN 500 MG PO CAPS Oral Take 500 mg by mouth 3 (three) times daily.     . ASPIRIN 81 MG PO TABS Oral Take 81 mg by mouth daily.      . ATORVASTATIN CALCIUM  20 MG PO TABS Oral Take 20 mg by mouth daily.      . DESVENLAFAXINE SUCCINATE ER 50 MG PO TB24 Oral Take 50 mg by mouth daily.      Marland Kitchen ESOMEPRAZOLE MAGNESIUM 40 MG PO CPDR Oral Take 1 capsule (40 mg total) by mouth daily before breakfast. 30 capsule 3  . EVENING PRIMROSE OIL 1000 MG PO CAPS Oral Take 1 capsule by mouth daily.     Marland Kitchen HYDROCHLOROTHIAZIDE PO Oral Take 1 tablet by mouth 3 (three) times daily.      Marland Kitchen HYDROXYZINE HCL 10 MG PO TABS      . HYOSCYAMINE SULFATE 0.125 MG SL SUBL  Take one sublingual every 4 hours prn abdominal pain/spasm 30 tablet 1  . IBUPROFEN 800 MG PO TABS Oral Take 800 mg by mouth as needed.      . L-METHYLFOLATE-B6-B12 3-35-2 MG PO TABS Oral Take 1 tablet by mouth daily.      Marland Kitchen LEVOTHYROXINE SODIUM 100 MCG PO TABS Oral Take 100 mcg by mouth daily.      Marland Kitchen MEMANTINE HCL 10 MG PO TABS Oral Take 10 mg by mouth 2 (two) times daily.      Marland Kitchen MODAFINIL 100 MG PO TABS Oral  Take 100 mg by mouth daily.      Marland Kitchen OMEPRAZOLE 20 MG PO CPDR Oral Take 20 mg by mouth 2 (two) times daily.      Marland Kitchen ONDANSETRON HCL 4 MG PO TABS Oral Take 4 mg by mouth every 8 (eight) hours as needed.        BP 134/76  Pulse 94  Temp 98.2 F (36.8 C) (Oral)  Resp 16  Wt 160 lb (72.576 kg)  SpO2 100%  Physical Exam  ED Course  Procedures (including critical care time)  Labs Reviewed - No data to display No results found.   No diagnosis found.    MDM  Patient given iv meds and fluids for headache and symmptoms resolved.       Hilario Quarry, MD 10/21/11 1236

## 2012-09-12 DIAGNOSIS — E785 Hyperlipidemia, unspecified: Secondary | ICD-10-CM | POA: Insufficient documentation

## 2012-09-12 DIAGNOSIS — G4733 Obstructive sleep apnea (adult) (pediatric): Secondary | ICD-10-CM | POA: Insufficient documentation

## 2012-09-27 ENCOUNTER — Encounter (HOSPITAL_BASED_OUTPATIENT_CLINIC_OR_DEPARTMENT_OTHER): Payer: Self-pay

## 2012-09-27 ENCOUNTER — Emergency Department (HOSPITAL_BASED_OUTPATIENT_CLINIC_OR_DEPARTMENT_OTHER)
Admission: EM | Admit: 2012-09-27 | Discharge: 2012-09-27 | Disposition: A | Discharge status: Home / Self Care | Payer: Medicare Other | Attending: Emergency Medicine | Admitting: Emergency Medicine

## 2012-09-27 ENCOUNTER — Emergency Department (HOSPITAL_BASED_OUTPATIENT_CLINIC_OR_DEPARTMENT_OTHER): Payer: Medicare Other

## 2012-09-27 DIAGNOSIS — E039 Hypothyroidism, unspecified: Secondary | ICD-10-CM | POA: Insufficient documentation

## 2012-09-27 DIAGNOSIS — Z7982 Long term (current) use of aspirin: Secondary | ICD-10-CM | POA: Insufficient documentation

## 2012-09-27 DIAGNOSIS — G43909 Migraine, unspecified, not intractable, without status migrainosus: Secondary | ICD-10-CM | POA: Insufficient documentation

## 2012-09-27 DIAGNOSIS — Z79899 Other long term (current) drug therapy: Secondary | ICD-10-CM | POA: Insufficient documentation

## 2012-09-27 DIAGNOSIS — Z87891 Personal history of nicotine dependence: Secondary | ICD-10-CM | POA: Insufficient documentation

## 2012-09-27 DIAGNOSIS — M1990 Unspecified osteoarthritis, unspecified site: Secondary | ICD-10-CM | POA: Insufficient documentation

## 2012-09-27 DIAGNOSIS — K219 Gastro-esophageal reflux disease without esophagitis: Secondary | ICD-10-CM | POA: Insufficient documentation

## 2012-09-27 DIAGNOSIS — F909 Attention-deficit hyperactivity disorder, unspecified type: Secondary | ICD-10-CM | POA: Insufficient documentation

## 2012-09-27 DIAGNOSIS — Z8739 Personal history of other diseases of the musculoskeletal system and connective tissue: Secondary | ICD-10-CM | POA: Insufficient documentation

## 2012-09-27 DIAGNOSIS — M810 Age-related osteoporosis without current pathological fracture: Secondary | ICD-10-CM | POA: Insufficient documentation

## 2012-09-27 DIAGNOSIS — Z8719 Personal history of other diseases of the digestive system: Secondary | ICD-10-CM | POA: Insufficient documentation

## 2012-09-27 DIAGNOSIS — F3289 Other specified depressive episodes: Secondary | ICD-10-CM | POA: Insufficient documentation

## 2012-09-27 DIAGNOSIS — N3010 Interstitial cystitis (chronic) without hematuria: Secondary | ICD-10-CM | POA: Insufficient documentation

## 2012-09-27 DIAGNOSIS — M62830 Muscle spasm of back: Secondary | ICD-10-CM

## 2012-09-27 DIAGNOSIS — K589 Irritable bowel syndrome without diarrhea: Secondary | ICD-10-CM | POA: Insufficient documentation

## 2012-09-27 DIAGNOSIS — M62838 Other muscle spasm: Secondary | ICD-10-CM | POA: Insufficient documentation

## 2012-09-27 DIAGNOSIS — E785 Hyperlipidemia, unspecified: Secondary | ICD-10-CM | POA: Insufficient documentation

## 2012-09-27 DIAGNOSIS — F329 Major depressive disorder, single episode, unspecified: Secondary | ICD-10-CM | POA: Insufficient documentation

## 2012-09-27 DIAGNOSIS — M5416 Radiculopathy, lumbar region: Secondary | ICD-10-CM

## 2012-09-27 DIAGNOSIS — IMO0002 Reserved for concepts with insufficient information to code with codable children: Secondary | ICD-10-CM | POA: Insufficient documentation

## 2012-09-27 DIAGNOSIS — M79609 Pain in unspecified limb: Secondary | ICD-10-CM | POA: Insufficient documentation

## 2012-09-27 DIAGNOSIS — M2550 Pain in unspecified joint: Secondary | ICD-10-CM | POA: Insufficient documentation

## 2012-09-27 DIAGNOSIS — F4310 Post-traumatic stress disorder, unspecified: Secondary | ICD-10-CM | POA: Insufficient documentation

## 2012-09-27 DIAGNOSIS — E119 Type 2 diabetes mellitus without complications: Secondary | ICD-10-CM | POA: Insufficient documentation

## 2012-09-27 DIAGNOSIS — F410 Panic disorder [episodic paroxysmal anxiety] without agoraphobia: Secondary | ICD-10-CM | POA: Insufficient documentation

## 2012-09-27 HISTORY — DX: Hyperlipidemia, unspecified: E78.5

## 2012-09-27 MED ORDER — TRAMADOL HCL 50 MG PO TABS
50.0000 mg | ORAL_TABLET | Freq: Once | ORAL | Status: AC
  Administered 2012-09-27: 50 mg via ORAL
  Filled 2012-09-27: qty 1

## 2012-09-27 MED ORDER — TRAMADOL HCL 50 MG PO TABS: 50.0000 mg | ORAL_TABLET | Freq: Four times a day (QID) | ORAL | Status: DC | PRN

## 2012-09-27 MED ORDER — PREDNISONE 10 MG PO TABS: ORAL_TABLET | ORAL | Status: DC

## 2012-09-27 MED ORDER — PREDNISONE 20 MG PO TABS
20.0000 mg | ORAL_TABLET | Freq: Once | ORAL | Status: AC
  Administered 2012-09-27: 20 mg via ORAL
  Filled 2012-09-27: qty 1

## 2012-09-27 NOTE — ED Notes (Signed)
Darius, Rad Tech to put images on CD for the patient and deliver to her room.

## 2012-09-27 NOTE — ED Notes (Signed)
Pt states that she has severe pain in the R leg down the lateral side of her leg, radiating to the R groin.  Pt states that she saw PCP for this about 2 weeks ago, was told to come back if no improvement in two weeks after doing water therapy.  Pt states that she has not followed up with PCP yet, but that pain has become unbearable despite taking motrin for the past two days with no relief.  PMS intact.

## 2012-09-27 NOTE — Discharge Instructions (Signed)
Radicular Pain Radicular pain in either the arm or leg is usually from a bulging or herniated disk in the spine. A piece of the herniated disk may press against the nerves as the nerves exit the spine. This causes pain which is felt at the tips of the nerves down the arm or leg. Other causes of radicular pain may include:  Fractures.  Heart disease.  Cancer.  An abnormal and usually degenerative state of the nervous system or nerves (neuropathy). Diagnosis may require CT or MRI scanning to determine the primary cause.  Nerves that start at the neck (nerve roots) may cause radicular pain in the outer shoulder and arm. It can spread down to the thumb and fingers. The symptoms vary depending on which nerve root has been affected. In most cases radicular pain improves with conservative treatment. Neck problems may require physical therapy, a neck collar, or cervical traction. Treatment may take many weeks, and surgery may be considered if the symptoms do not improve.  Conservative treatment is also recommended for sciatica. Sciatica causes pain to radiate from the lower back or buttock area down the leg into the foot. Often there is a history of back problems. Most patients with sciatica are better after 2 to 4 weeks of rest and other supportive care. Short term bed rest can reduce the disk pressure considerably. Sitting, however, is not a good position since this increases the pressure on the disk. You should avoid bending, lifting, and all other activities which make the problem worse. Traction can be used in severe cases. Surgery is usually reserved for patients who do not improve within the first months of treatment. Only take over-the-counter or prescription medicines for pain, discomfort, or fever as directed by your caregiver. Narcotics and muscle relaxants may help by relieving more severe pain and spasm and by providing mild sedation. Cold or massage can give significant relief. Spinal manipulation  is not recommended. It can increase the degree of disc protrusion. Epidural steroid injections are often effective treatment for radicular pain. These injections deliver medicine to the spinal nerve in the space between the protective covering of the spinal cord and back bones (vertebrae). Your caregiver can give you more information about steroid injections. These injections are most effective when given within two weeks of the onset of pain.  You should see your caregiver for follow up care as recommended. A program for neck and back injury rehabilitation with stretching and strengthening exercises is an important part of management.  SEEK IMMEDIATE MEDICAL CARE IF:  You develop increased pain, weakness, or numbness in your arm or leg.  You develop difficulty with bladder or bowel control.  You develop abdominal pain. Document Released: 05/02/2004 Document Revised: 06/17/2011 Document Reviewed: 07/18/2008 Exit[employer] Patient Information 2014 [employer].  

## 2012-09-27 NOTE — ED Provider Notes (Signed)
History     CSN: [id number]  Arrival date & time 09/27/12  1111   First MD Initiated Contact with Patient 09/27/12 1158      Chief Complaint  Patient presents with  . Leg Pain    (Consider location/radiation/quality/duration/timing/severity/associated sxs/prior treatment) Patient is a 67 y.o. female presenting with back pain. The history is provided by the patient and the spouse.  Back Pain Location:  Lumbar spine Quality:  Shooting, aching and stabbing Radiates to:  R foot and R thigh (right groin) Pain severity:  Moderate Pain is:  Worse during the night Onset quality:  Gradual Duration:  2 weeks Timing:  Constant Progression:  Worsening Chronicity:  New Context: not falling, not lifting heavy objects, not physical stress, not recent illness, not recent injury and not twisting   Relieved by:  Ibuprofen Associated symptoms: leg pain   Associated symptoms: no abdominal pain, no bladder incontinence, no bowel incontinence, no chest pain, no dysuria and no fever   Risk factors: no recent surgery and no vascular disease     Past Medical History  Diagnosis Date  . Attention deficit disorder with hyperactivity(314.01)   . Other chronic cystitis   . Posttraumatic stress disorder     followed by Dr Ladona Ridgel, psychiatry  . Depressive disorder, not elsewhere classified   . Esophageal reflux   . Stricture and stenosis of esophagus   . Other and unspecified hyperlipidemia   . Unspecified hypothyroidism   . Insomnia   . IBS (irritable bowel syndrome)   . Osteoarthritis   . Migraine   . Obstructive sleep apnea   . Temporomandibular joint disorders, unspecified   . Thoracic spondylosis without myelopathy   . Acute colitis   . Panic attacks   . Diabetes mellitus     diet controlled  . Osteoporosis   . HLD (hyperlipidemia)     Past Surgical History  Procedure Laterality Date  . Appendectomy    . Cholecystectomy    . Tubal ligation    . Bladder surgery    . Eye surgery     . Abdominal hysterectomy    . Tonsillectomy      Family History  Problem Relation Age of Onset  . Heart attack Father   . Heart failure Father   . Liver disease Father   . Heart disease Father   . COPD Mother   . Depression Mother   . Diabetes Mother   . Crohn's disease Son     History  Substance Use Topics  . Smoking status: Former Games developer  . Smokeless tobacco: Never Used  . Alcohol Use: No    OB History   Grav Para Term Preterm Abortions TAB SAB Ect Mult Living                  Review of Systems  Constitutional: Negative for fever and chills.  Cardiovascular: Negative for chest pain.  Gastrointestinal: Negative for abdominal pain and bowel incontinence.  Genitourinary: Negative for bladder incontinence, dysuria and difficulty urinating.  Musculoskeletal: Positive for back pain and arthralgias.  Skin: Negative for color change, pallor, rash and wound.  All other systems reviewed and are negative.    Allergies  Tetracycline  Home Medications   Current Outpatient Rx  Name  Route  Sig  Dispense  Refill  . aspirin 81 MG tablet   Oral   Take 81 mg by mouth daily.           Marland Kitchen atorvastatin (LIPITOR) 20 MG  tablet   Oral   Take 20 mg by mouth daily.           Marland Kitchen esomeprazole (NEXIUM) 40 MG capsule   Oral   Take 40 mg by mouth daily before breakfast.         . folic acid (FOLVITE) 1 MG tablet   Oral   Take 1 mg by mouth daily.         . hydrOXYzine (ATARAX/VISTARIL) 10 MG tablet               . ibuprofen (ADVIL,MOTRIN) 800 MG tablet   Oral   Take 800 mg by mouth as needed.           Marland Kitchen l-methylfolate-B6-B12 (METANX) 3-35-2 MG TABS   Oral   Take 1 tablet by mouth daily.           Marland Kitchen levothyroxine (SYNTHROID, LEVOTHROID) 100 MCG tablet   Oral   Take 100 mcg by mouth daily.           . memantine (NAMENDA) 10 MG tablet   Oral   Take 10 mg by mouth 2 (two) times daily.           Marland Kitchen omeprazole (PRILOSEC) 20 MG capsule   Oral   Take  20 mg by mouth 2 (two) times daily.           . Acetylcysteine (N-ACETYL-L-CYSTEINE) 600 MG CAPS   Oral   Take 1 tablet by mouth daily.           Marland Kitchen amoxicillin (AMOXIL) 500 MG capsule   Oral   Take 500 mg by mouth 3 (three) times daily.          Marland Kitchen desvenlafaxine (PRISTIQ) 50 MG 24 hr tablet   Oral   Take 50 mg by mouth daily.           . Evening Primrose Oil 1000 MG CAPS   Oral   Take 1 capsule by mouth daily.          Marland Kitchen HYDROCHLOROTHIAZIDE PO   Oral   Take 1 tablet by mouth 3 (three) times daily.           . hyoscyamine (LEVSIN/SL) 0.125 MG SL tablet      Take one sublingual every 4 hours prn abdominal pain/spasm   30 tablet   1   . modafinil (PROVIGIL) 100 MG tablet   Oral   Take 100 mg by mouth daily.           . ondansetron (ZOFRAN) 4 MG tablet   Oral   Take 4 mg by mouth every 8 (eight) hours as needed.           . predniSONE (DELTASONE) 10 MG tablet      Take 3 tablets by mouth daily for 2 days, then 2 tablets by mouth daily for 3 days, then 1 tablet by mouth daily for 3 days   15 tablet   0   . SUMAtriptan (IMITREX) 100 MG tablet   Oral   Take 100 mg by mouth every 2 (two) hours as needed.           . traMADol (ULTRAM) 50 MG tablet   Oral   Take 1 tablet (50 mg total) by mouth every 6 (six) hours as needed for pain.   15 tablet   0     BP 143/59  Pulse 87  Temp(Src) 97.6 F (36.4 C) (Oral)  Resp 17  Ht 5\' 1"  (1.549 m)  Wt 159 lb (72.122 kg)  BMI 30.06 kg/m2  SpO2 100%  Physical Exam  Nursing note and vitals reviewed. Constitutional: She is oriented to person, place, and time. She appears well-developed and well-nourished.  HENT:  Head: Normocephalic and atraumatic.  Eyes: No scleral icterus.  Neck: Neck supple.  Pulmonary/Chest: Effort normal. No respiratory distress.  Abdominal: Soft. She exhibits no distension. There is no tenderness.  Musculoskeletal: She exhibits no edema and no tenderness.       Lumbar back: She  exhibits tenderness. She exhibits no bony tenderness, no swelling, no edema, no deformity and normal pulse.       Back:  Neurological: She is alert and oriented to person, place, and time. She displays no atrophy and no tremor. No sensory deficit. She exhibits normal muscle tone. Coordination normal.  Reflex Scores:      Patellar reflexes are 2+ on the right side and 2+ on the left side. Skin: Skin is warm. No rash noted. No pallor.    ED Course  Procedures (including critical care time)  Labs Reviewed - No data to display Dg Lumbar Spine Complete  09/27/2012   *RADIOLOGY REPORT*  Clinical Data: Leg pain  LUMBAR SPINE - COMPLETE 4+ VIEW  Comparison: None.  Findings: Mild curvature of the lumbar spine is convex towards the right.  The vertebral body heights are well preserved.  There is mild disc space narrowing and ventral endplate spurring within the lower lumbar spine.  There is no fracture or subluxation noted.  IMPRESSION:  1.  No acute findings.   Original Report Authenticated By: Signa Kell, M.D.   I reviewed the above films myself.  1. Spasm of lumbar paraspinous muscle   2. Right lumbar radiculopathy     ra sat is 100% and  I interpret to be normal  MDM  Pt with lumbar pain, likely radicular.  No fever, no incontinence, has good distal strength and sensation, normal patellar reflexes symmetric.  Pt is able to walk, obtained plain films which are neg.  Pt may require MRI of L spine which can be re-assessed by PCP.  Will start on prednisone and provide ultram for pain.          Gavin Pound. Anquan Azzarello, MD 09/27/12 1338

## 2013-01-14 ENCOUNTER — Other Ambulatory Visit: Payer: Self-pay | Admitting: Neurosurgery

## 2013-01-22 ENCOUNTER — Encounter (HOSPITAL_COMMUNITY): Payer: Self-pay | Admitting: Pharmacy Technician

## 2013-01-25 ENCOUNTER — Encounter (HOSPITAL_COMMUNITY)
Admission: RE | Admit: 2013-01-25 | Discharge: 2013-01-25 | Disposition: A | Discharge status: Home / Self Care | Payer: Medicare Other | Source: Ambulatory Visit | Attending: Anesthesiology | Admitting: Anesthesiology

## 2013-01-25 ENCOUNTER — Encounter (HOSPITAL_COMMUNITY): Payer: Self-pay

## 2013-01-25 ENCOUNTER — Encounter (HOSPITAL_COMMUNITY)
Admission: RE | Admit: 2013-01-25 | Discharge: 2013-01-25 | Disposition: A | Discharge status: Home / Self Care | Payer: Medicare Other | Source: Ambulatory Visit | Attending: Neurosurgery | Admitting: Neurosurgery

## 2013-01-25 HISTORY — DX: Adverse effect of unspecified anesthetic, initial encounter: T41.45XA

## 2013-01-25 HISTORY — DX: Other complications of anesthesia, initial encounter: T88.59XA

## 2013-01-25 HISTORY — DX: Calculus of kidney: N20.0

## 2013-01-25 HISTORY — DX: Cardiac arrhythmia, unspecified: I49.9

## 2013-01-25 LAB — CBC
HCT: 40.7 % (ref 36.0–46.0)
Hemoglobin: 13.6 g/dL (ref 12.0–15.0)
MCH: 28.8 pg (ref 26.0–34.0)
MCHC: 33.4 g/dL (ref 30.0–36.0)
MCV: 86.2 fL (ref 78.0–100.0)
Platelets: 254 10*3/uL (ref 150–400)
RBC: 4.72 MIL/uL (ref 3.87–5.11)
RDW: 15.9 % — ABNORMAL HIGH (ref 11.5–15.5)
WBC: 9.6 10*3/uL (ref 4.0–10.5)

## 2013-01-25 LAB — BASIC METABOLIC PANEL
BUN: 13 mg/dL (ref 6–23)
CO2: 21 mEq/L (ref 19–32)
Calcium: 9.4 mg/dL (ref 8.4–10.5)
Chloride: 101 mEq/L (ref 96–112)
Creatinine, Ser: 0.88 mg/dL (ref 0.50–1.10)
GFR calc Af Amer: 76 mL/min — ABNORMAL LOW (ref >90)
GFR calc non Af Amer: 66 mL/min — ABNORMAL LOW (ref >90)
Glucose, Bld: 96 mg/dL (ref 70–99)
Potassium: 5.1 mEq/L (ref 3.5–5.1)
Sodium: 134 mEq/L — ABNORMAL LOW (ref 135–145)

## 2013-01-25 LAB — SURGICAL PCR SCREEN
MRSA, PCR: NEGATIVE
Staphylococcus aureus: POSITIVE — AB

## 2013-01-25 NOTE — Pre-Procedure Instructions (Addendum)
Brianna [name]  01/25/2013   Your procedure is scheduled on:   Oct 23  Report to [facility]  2 * 3 at 1000 AM.  Call this number if you have problems the morning of surgery: [phone]   Remember:   Do not eat food or drink liquids after midnight.   Take these medicines the morning of surgery with A SIP OF WATER: Esomeprazole (Nexium), Levothyroxine (Synthroid), Hydroxyzine (Atarax) if needed, and Percocet (Oxycodone-acetaminophen) if needed  Stop taking Aspirin, Aleve, Ibuprofen, BC's, Goody's, Herbal Medications, and Fish Oil   Do not wear jewelry, make-up or nail polish.  Do not wear lotions, powders, or perfumes. You may wear deodorant.  Do not shave 48 hours prior to surgery. Men may shave face and neck.  Do not bring valuables to the hospital.  [facility] is not responsible                  for any belongings or valuables.               Contacts, dentures or bridgework may not be worn into surgery.  Leave suitcase in the car. After surgery it may be brought to your room.  For patients admitted to the hospital, discharge time is determined by your                treatment team.               Patients discharged the day of surgery will not be allowed to drive  home.    Special Instructions: Shower using CHG 2 nights before surgery and the night before surgery.  If you shower the day of surgery use CHG.  Use special wash - you have one bottle of CHG for all showers.  You should use approximately 1/3 of the bottle for each shower.   Please read over the following fact sheets that you were given: Pain Booklet, Coughing and Deep Breathing, MRSA Information and Surgical Site Infection Prevention

## 2013-01-25 NOTE — Progress Notes (Signed)
PCP is Dr Genia Del. Agular Denies seeing a Cardiologist Denies having a recent CXR or EKG Denies having a stress test, echo, or card cath.  Pt voices concern about being NPO for 12 hours. She states that she may want to have a cup or coffee or a glass of soda around  0200 the morning before surgery. Instructed the pt of the need to be NPO after Midnight, she still voices concerns and wants me to call Revonda Standard and ask why she needs to be NPO for so long.  Pt encouraged to call the surgeon and ask him if this will be ok, due to possible change in surgery time.  Pt Voices understanding.

## 2013-01-25 NOTE — Progress Notes (Signed)
Results were positive for staph Brianna [name] called and informed

## 2013-01-25 NOTE — Progress Notes (Addendum)
Dr Trudee Grip office called and informed of positive Staph PCR spoke with Lupita Leash and Darl Pikes.

## 2013-01-27 MED ORDER — DEXAMETHASONE SODIUM PHOSPHATE 10 MG/ML IJ SOLN
10.0000 mg | INTRAMUSCULAR | Status: DC
  Filled 2013-01-27: qty 1

## 2013-01-27 MED ORDER — CEFAZOLIN SODIUM-DEXTROSE 2-3 GM-% IV SOLR
2.0000 g | INTRAVENOUS | Status: AC
  Administered 2013-01-28: 2 g via INTRAVENOUS
  Filled 2013-01-27: qty 50

## 2013-01-27 NOTE — Progress Notes (Signed)
Pt notified of time change;to arrive at 0700 

## 2013-01-28 ENCOUNTER — Encounter (HOSPITAL_COMMUNITY): Payer: Self-pay | Admitting: Anesthesiology

## 2013-01-28 ENCOUNTER — Encounter (HOSPITAL_COMMUNITY)
Admission: RE | Disposition: A | Discharge status: Home / Self Care | Payer: Self-pay | Source: Ambulatory Visit | Attending: Neurosurgery

## 2013-01-28 ENCOUNTER — Observation Stay (HOSPITAL_COMMUNITY)
Admission: RE | Admit: 2013-01-28 | Discharge: 2013-01-29 | Disposition: A | Discharge status: Home / Self Care | Payer: Medicare Other | Source: Ambulatory Visit | Attending: Neurosurgery | Admitting: Neurosurgery

## 2013-01-28 ENCOUNTER — Ambulatory Visit (HOSPITAL_COMMUNITY): Payer: Medicare Other

## 2013-01-28 ENCOUNTER — Encounter (HOSPITAL_COMMUNITY): Payer: Medicare Other | Admitting: Anesthesiology

## 2013-01-28 ENCOUNTER — Ambulatory Visit (HOSPITAL_COMMUNITY): Payer: Medicare Other | Admitting: Anesthesiology

## 2013-01-28 DIAGNOSIS — Z01810 Encounter for preprocedural cardiovascular examination: Secondary | ICD-10-CM | POA: Insufficient documentation

## 2013-01-28 DIAGNOSIS — Z01818 Encounter for other preprocedural examination: Secondary | ICD-10-CM | POA: Insufficient documentation

## 2013-01-28 DIAGNOSIS — Z79899 Other long term (current) drug therapy: Secondary | ICD-10-CM | POA: Insufficient documentation

## 2013-01-28 DIAGNOSIS — M5126 Other intervertebral disc displacement, lumbar region: Principal | ICD-10-CM | POA: Insufficient documentation

## 2013-01-28 DIAGNOSIS — Z01812 Encounter for preprocedural laboratory examination: Secondary | ICD-10-CM | POA: Insufficient documentation

## 2013-01-28 DIAGNOSIS — E119 Type 2 diabetes mellitus without complications: Secondary | ICD-10-CM | POA: Insufficient documentation

## 2013-01-28 DIAGNOSIS — Z23 Encounter for immunization: Secondary | ICD-10-CM | POA: Insufficient documentation

## 2013-01-28 DIAGNOSIS — K219 Gastro-esophageal reflux disease without esophagitis: Secondary | ICD-10-CM | POA: Insufficient documentation

## 2013-01-28 DIAGNOSIS — E785 Hyperlipidemia, unspecified: Secondary | ICD-10-CM | POA: Insufficient documentation

## 2013-01-28 HISTORY — PX: LUMBAR LAMINECTOMY/DECOMPRESSION MICRODISCECTOMY: SHX5026

## 2013-01-28 LAB — GLUCOSE, CAPILLARY
Glucose-Capillary: 94 mg/dL (ref 70–99)
Glucose-Capillary: 91 mg/dL (ref 70–99)
Glucose-Capillary: 87 mg/dL (ref 70–99)
Glucose-Capillary: 146 mg/dL — ABNORMAL HIGH (ref 70–99)

## 2013-01-28 SURGERY — LUMBAR LAMINECTOMY/DECOMPRESSION MICRODISCECTOMY 1 LEVEL
Anesthesia: General | Site: Back | Laterality: Right | Wound class: Clean

## 2013-01-28 MED ORDER — HYDROXYZINE HCL 10 MG PO TABS
10.0000 mg | ORAL_TABLET | ORAL | Status: DC | PRN
  Administered 2013-01-29: 10 mg via ORAL
  Filled 2013-01-28: qty 1

## 2013-01-28 MED ORDER — OXYCODONE HCL 5 MG/5ML PO SOLN: 5.0000 mg | Freq: Once | ORAL | Status: DC | PRN

## 2013-01-28 MED ORDER — SODIUM CHLORIDE 0.9 % IJ SOLN: 3.0000 mL | INTRAMUSCULAR | Status: DC | PRN

## 2013-01-28 MED ORDER — ACETAMINOPHEN 325 MG PO TABS: 650.0000 mg | ORAL_TABLET | ORAL | Status: DC | PRN

## 2013-01-28 MED ORDER — NEOSTIGMINE METHYLSULFATE 1 MG/ML IJ SOLN
INTRAMUSCULAR | Status: DC | PRN
  Administered 2013-01-28: 5 mg via INTRAVENOUS

## 2013-01-28 MED ORDER — ARTIFICIAL TEARS OP OINT
TOPICAL_OINTMENT | OPHTHALMIC | Status: DC | PRN
  Administered 2013-01-28: 1 via OPHTHALMIC

## 2013-01-28 MED ORDER — PROPOFOL 10 MG/ML IV BOLUS
INTRAVENOUS | Status: DC | PRN
  Administered 2013-01-28: 120 mg via INTRAVENOUS

## 2013-01-28 MED ORDER — SODIUM CHLORIDE 0.9 % IR SOLN
Status: DC | PRN
  Administered 2013-01-28: 09:00:00

## 2013-01-28 MED ORDER — MENTHOL 3 MG MT LOZG: 1.0000 | LOZENGE | OROMUCOSAL | Status: DC | PRN

## 2013-01-28 MED ORDER — OXYCODONE-ACETAMINOPHEN 5-325 MG PO TABS
1.0000 | ORAL_TABLET | ORAL | Status: DC | PRN
  Administered 2013-01-28 – 2013-01-29 (×5): 2 via ORAL
  Filled 2013-01-28 (×4): qty 2

## 2013-01-28 MED ORDER — ROCURONIUM BROMIDE 100 MG/10ML IV SOLN
INTRAVENOUS | Status: DC | PRN
  Administered 2013-01-28: 50 mg via INTRAVENOUS

## 2013-01-28 MED ORDER — THROMBIN 5000 UNITS EX SOLR
CUTANEOUS | Status: DC | PRN
  Administered 2013-01-28 (×2): 5000 [IU] via TOPICAL

## 2013-01-28 MED ORDER — LACTATED RINGERS IV SOLN
INTRAVENOUS | Status: DC | PRN
  Administered 2013-01-28: 09:00:00 via INTRAVENOUS

## 2013-01-28 MED ORDER — BUPIVACAINE HCL (PF) 0.5 % IJ SOLN
INTRAMUSCULAR | Status: DC | PRN
  Administered 2013-01-28: 20 mL

## 2013-01-28 MED ORDER — MIDAZOLAM HCL 2 MG/2ML IJ SOLN
0.5000 mg | Freq: Once | INTRAMUSCULAR | Status: AC | PRN
  Administered 2013-01-28: 0.5 mg via INTRAVENOUS

## 2013-01-28 MED ORDER — CEFAZOLIN SODIUM 1-5 GM-% IV SOLN
1.0000 g | Freq: Three times a day (TID) | INTRAVENOUS | Status: AC
  Administered 2013-01-28 (×2): 1 g via INTRAVENOUS
  Filled 2013-01-28 (×2): qty 50

## 2013-01-28 MED ORDER — OXYCODONE-ACETAMINOPHEN 5-325 MG PO TABS
ORAL_TABLET | ORAL | Status: AC
  Administered 2013-01-28: 2 via ORAL
  Filled 2013-01-28: qty 2

## 2013-01-28 MED ORDER — KETOROLAC TROMETHAMINE 30 MG/ML IJ SOLN
30.0000 mg | Freq: Four times a day (QID) | INTRAMUSCULAR | Status: DC
  Administered 2013-01-28 – 2013-01-29 (×3): 30 mg via INTRAVENOUS
  Filled 2013-01-28 (×7): qty 1

## 2013-01-28 MED ORDER — DEXAMETHASONE 4 MG PO TABS
4.0000 mg | ORAL_TABLET | Freq: Four times a day (QID) | ORAL | Status: AC
  Administered 2013-01-28: 4 mg via ORAL
  Filled 2013-01-28: qty 1

## 2013-01-28 MED ORDER — MIDAZOLAM HCL 5 MG/5ML IJ SOLN
INTRAMUSCULAR | Status: DC | PRN
  Administered 2013-01-28 (×2): 1 mg via INTRAVENOUS

## 2013-01-28 MED ORDER — LIDOCAINE HCL (CARDIAC) 20 MG/ML IV SOLN
INTRAVENOUS | Status: DC | PRN
  Administered 2013-01-28: 40 mg via INTRAVENOUS

## 2013-01-28 MED ORDER — PANTOPRAZOLE SODIUM 40 MG PO TBEC
40.0000 mg | DELAYED_RELEASE_TABLET | Freq: Every day | ORAL | Status: DC
  Administered 2013-01-28: 40 mg via ORAL
  Filled 2013-01-28: qty 1

## 2013-01-28 MED ORDER — ACETAMINOPHEN 650 MG RE SUPP: 650.0000 mg | RECTAL | Status: DC | PRN

## 2013-01-28 MED ORDER — LEVOTHYROXINE SODIUM 50 MCG PO TABS
50.0000 ug | ORAL_TABLET | Freq: Every day | ORAL | Status: DC
  Administered 2013-01-29: 50 ug via ORAL
  Filled 2013-01-28 (×2): qty 1

## 2013-01-28 MED ORDER — PANTOPRAZOLE SODIUM 40 MG IV SOLR
40.0000 mg | Freq: Every day | INTRAVENOUS | Status: DC
  Filled 2013-01-28: qty 40

## 2013-01-28 MED ORDER — MIDAZOLAM HCL 2 MG/2ML IJ SOLN
INTRAMUSCULAR | Status: AC
  Administered 2013-01-28: 0.5 mg via INTRAVENOUS
  Filled 2013-01-28: qty 2

## 2013-01-28 MED ORDER — ATORVASTATIN CALCIUM 20 MG PO TABS
20.0000 mg | ORAL_TABLET | Freq: Every day | ORAL | Status: DC
  Administered 2013-01-28: 20 mg via ORAL
  Filled 2013-01-28 (×2): qty 1

## 2013-01-28 MED ORDER — HYDROMORPHONE HCL PF 1 MG/ML IJ SOLN
INTRAMUSCULAR | Status: AC
  Administered 2013-01-28: 0.5 mg via INTRAVENOUS
  Filled 2013-01-28: qty 1

## 2013-01-28 MED ORDER — FENTANYL CITRATE 0.05 MG/ML IJ SOLN
INTRAMUSCULAR | Status: DC | PRN
  Administered 2013-01-28: 150 ug via INTRAVENOUS
  Administered 2013-01-28 (×2): 50 ug via INTRAVENOUS

## 2013-01-28 MED ORDER — HYDROMORPHONE HCL PF 1 MG/ML IJ SOLN: 1.0000 mg | INTRAMUSCULAR | Status: DC | PRN

## 2013-01-28 MED ORDER — PHENOL 1.4 % MT LIQD: 1.0000 | OROMUCOSAL | Status: DC | PRN

## 2013-01-28 MED ORDER — PROMETHAZINE HCL 25 MG/ML IJ SOLN: 6.2500 mg | INTRAMUSCULAR | Status: DC | PRN

## 2013-01-28 MED ORDER — GLYCOPYRROLATE 0.2 MG/ML IJ SOLN
INTRAMUSCULAR | Status: DC | PRN
  Administered 2013-01-28: .8 mg via INTRAVENOUS

## 2013-01-28 MED ORDER — LACTATED RINGERS IV SOLN
INTRAVENOUS | Status: DC
  Administered 2013-01-28: 07:00:00 via INTRAVENOUS

## 2013-01-28 MED ORDER — DEXAMETHASONE SODIUM PHOSPHATE 4 MG/ML IJ SOLN: 4.0000 mg | Freq: Four times a day (QID) | INTRAMUSCULAR | Status: AC

## 2013-01-28 MED ORDER — INFLUENZA VAC SPLIT QUAD 0.5 ML IM SUSP
0.5000 mL | INTRAMUSCULAR | Status: AC
  Administered 2013-01-29: 0.5 mL via INTRAMUSCULAR
  Filled 2013-01-28: qty 0.5

## 2013-01-28 MED ORDER — MUPIROCIN 2 % EX OINT
TOPICAL_OINTMENT | Freq: Two times a day (BID) | CUTANEOUS | Status: DC
  Administered 2013-01-29: 10:00:00 via NASAL

## 2013-01-28 MED ORDER — 0.9 % SODIUM CHLORIDE (POUR BTL) OPTIME
TOPICAL | Status: DC | PRN
  Administered 2013-01-28: 1000 mL

## 2013-01-28 MED ORDER — ONDANSETRON HCL 4 MG/2ML IJ SOLN: 4.0000 mg | INTRAMUSCULAR | Status: DC | PRN

## 2013-01-28 MED ORDER — SODIUM CHLORIDE 0.9 % IJ SOLN
3.0000 mL | Freq: Two times a day (BID) | INTRAMUSCULAR | Status: DC
  Administered 2013-01-28 (×2): 3 mL via INTRAVENOUS

## 2013-01-28 MED ORDER — MEPERIDINE HCL 25 MG/ML IJ SOLN: 6.2500 mg | INTRAMUSCULAR | Status: DC | PRN

## 2013-01-28 MED ORDER — HEMOSTATIC AGENTS (NO CHARGE) OPTIME
TOPICAL | Status: DC | PRN
  Administered 2013-01-28: 1 via TOPICAL

## 2013-01-28 MED ORDER — HYDROMORPHONE HCL PF 1 MG/ML IJ SOLN
0.2500 mg | INTRAMUSCULAR | Status: DC | PRN
  Administered 2013-01-28 (×4): 0.5 mg via INTRAVENOUS

## 2013-01-28 MED ORDER — OXYCODONE HCL 5 MG PO TABS: 5.0000 mg | ORAL_TABLET | Freq: Once | ORAL | Status: DC | PRN

## 2013-01-28 MED ORDER — KCL IN DEXTROSE-NACL 20-5-0.45 MEQ/L-%-% IV SOLN
80.0000 mL/h | INTRAVENOUS | Status: DC
  Filled 2013-01-28 (×4): qty 1000

## 2013-01-28 MED ORDER — ONDANSETRON HCL 4 MG/2ML IJ SOLN
INTRAMUSCULAR | Status: DC | PRN
  Administered 2013-01-28: 4 mg via INTRAVENOUS

## 2013-01-28 SURGICAL SUPPLY — 45 items
BAG DECANTER FOR FLEXI CONT (MISCELLANEOUS) ×2 IMPLANT
BENZOIN TINCTURE PRP APPL 2/3 (GAUZE/BANDAGES/DRESSINGS) ×2 IMPLANT
BLADE SURG ROTATE 9660 (MISCELLANEOUS) ×2 IMPLANT
BRUSH SCRUB EZ PLAIN DRY (MISCELLANEOUS) ×2 IMPLANT
BUR CUTTER 7.0 ROUND (BURR) ×2 IMPLANT
BUR MATCHSTICK NEURO 3.0 LAGG (BURR) ×2 IMPLANT
CANISTER SUCT 3000ML (MISCELLANEOUS) ×2 IMPLANT
CONT SPEC 4OZ CLIKSEAL STRL BL (MISCELLANEOUS) ×2 IMPLANT
DERMABOND ADVANCED (GAUZE/BANDAGES/DRESSINGS) ×1
DERMABOND ADVANCED .7 DNX12 (GAUZE/BANDAGES/DRESSINGS) ×1 IMPLANT
DRAPE LAPAROTOMY 100X72X124 (DRAPES) ×2 IMPLANT
DRAPE MICROSCOPE ZEISS OPMI (DRAPES) ×2 IMPLANT
DRAPE SURG 17X23 STRL (DRAPES) ×4 IMPLANT
DRESSING TELFA 8X3 (GAUZE/BANDAGES/DRESSINGS) ×2 IMPLANT
DURAPREP 26ML APPLICATOR (WOUND CARE) ×2 IMPLANT
ELECT REM PT RETURN 9FT ADLT (ELECTROSURGICAL) ×2
ELECTRODE REM PT RTRN 9FT ADLT (ELECTROSURGICAL) ×1 IMPLANT
GAUZE SPONGE 4X4 16PLY XRAY LF (GAUZE/BANDAGES/DRESSINGS) IMPLANT
GLOVE BIO SURGEON STRL SZ8 (GLOVE) ×2 IMPLANT
GLOVE BIOGEL PI IND STRL 7.0 (GLOVE) ×1 IMPLANT
GLOVE BIOGEL PI INDICATOR 7.0 (GLOVE) ×1
GLOVE ECLIPSE 8.0 STRL XLNG CF (GLOVE) ×2 IMPLANT
GLOVE OPTIFIT SS 6.5 STRL BRWN (GLOVE) ×2 IMPLANT
GLOVE SURG SS PI 6.0 STRL IVOR (GLOVE) ×2 IMPLANT
GOWN BRE IMP SLV AUR LG STRL (GOWN DISPOSABLE) ×2 IMPLANT
GOWN BRE IMP SLV AUR XL STRL (GOWN DISPOSABLE) ×2 IMPLANT
GOWN STRL REIN 2XL LVL4 (GOWN DISPOSABLE) IMPLANT
KIT BASIN OR (CUSTOM PROCEDURE TRAY) ×2 IMPLANT
KIT ROOM TURNOVER OR (KITS) ×2 IMPLANT
NEEDLE HYPO 22GX1.5 SAFETY (NEEDLE) ×2 IMPLANT
NEEDLE SPNL 22GX3.5 QUINCKE BK (NEEDLE) ×4 IMPLANT
NS IRRIG 1000ML POUR BTL (IV SOLUTION) ×2 IMPLANT
PACK LAMINECTOMY NEURO (CUSTOM PROCEDURE TRAY) ×2 IMPLANT
PAD ARMBOARD 7.5X6 YLW CONV (MISCELLANEOUS) ×6 IMPLANT
PATTIES SURGICAL .75X.75 (GAUZE/BANDAGES/DRESSINGS) ×2 IMPLANT
RUBBERBAND STERILE (MISCELLANEOUS) ×4 IMPLANT
SPONGE GAUZE 4X4 12PLY (GAUZE/BANDAGES/DRESSINGS) ×2 IMPLANT
SPONGE SURGIFOAM ABS GEL SZ50 (HEMOSTASIS) ×2 IMPLANT
STRIP CLOSURE SKIN 1/2X4 (GAUZE/BANDAGES/DRESSINGS) ×2 IMPLANT
SUT VIC AB 2-0 OS6 18 (SUTURE) ×6 IMPLANT
SUT VIC AB 3-0 CP2 18 (SUTURE) ×2 IMPLANT
SYR 20ML ECCENTRIC (SYRINGE) ×2 IMPLANT
TOWEL OR 17X24 6PK STRL BLUE (TOWEL DISPOSABLE) ×2 IMPLANT
TOWEL OR 17X26 10 PK STRL BLUE (TOWEL DISPOSABLE) ×2 IMPLANT
WATER STERILE IRR 1000ML POUR (IV SOLUTION) ×2 IMPLANT

## 2013-01-28 NOTE — Transfer of Care (Signed)
Immediate Anesthesia Transfer of Care Note  Patient: Brianna [name]  Procedure(s) Performed: Procedure(s): LUMBAR LAMINECTOMY/DECOMPRESSION MICRODISCECTOMY LUMBAR FOUR-FIVE (Right)  Patient Location: PACU  Anesthesia Type:General  Level of Consciousness: awake, alert  and oriented  Airway & Oxygen Therapy: Patient Spontanous Breathing and Patient connected to nasal cannula oxygen  Post-op Assessment: Report given to PACU RN, Post -op Vital signs reviewed and stable and Patient moving all extremities X 4  Post vital signs: Reviewed and stable  Complications: No apparent anesthesia complications

## 2013-01-28 NOTE — Anesthesia Procedure Notes (Signed)
Procedure Name: Intubation Date/Time: 01/28/2013 9:34 AM Performed by: Gayla Medicus Pre-anesthesia Checklist: Patient identified, Timeout performed, Emergency Drugs available, Suction available and Patient being monitored Patient Re-evaluated:Patient Re-evaluated prior to inductionOxygen Delivery Method: Circle system utilized Preoxygenation: Pre-oxygenation with 100% oxygen Intubation Type: IV induction Ventilation: Mask ventilation without difficulty and Oral airway inserted - appropriate to patient size Laryngoscope Size: Mac and 3 Grade View: Grade II Tube type: Oral Tube size: 7.5 mm Number of attempts: 1 Airway Equipment and Method: Stylet Placement Confirmation: ETT inserted through vocal cords under direct vision,  positive ETCO2 and breath sounds checked- equal and bilateral Secured at: 22 cm Tube secured with: Tape Dental Injury: Teeth and Oropharynx as per pre-operative assessment

## 2013-01-28 NOTE — Preoperative (Signed)
Beta Blockers   Reason not to administer Beta Blockers:Not Applicable 

## 2013-01-28 NOTE — Anesthesia Preprocedure Evaluation (Signed)
Anesthesia Evaluation  Patient identified by MRN, date of birth, ID band Patient awake    Reviewed: Allergy & Precautions, H&P , NPO status , Patient's Chart, lab work & pertinent test results  History of Anesthesia Complications Negative for: history of anesthetic complications  Airway Mallampati: II TM Distance: >3 FB Neck ROM: Full    Dental  (+) Caps and Dental Advisory Given   Pulmonary sleep apnea (does not tolerate CPAP) , former smoker (quit 20 years),  breath sounds clear to auscultation  Pulmonary exam normal       Cardiovascular - angina- dysrhythmias Rhythm:Regular Rate:Normal     Neuro/Psych  Headaches, PSYCHIATRIC DISORDERS (post traumatic stress) Anxiety Depression Chronic back pain: narcotics    GI/Hepatic Neg liver ROS, GERD-  Medicated and Controlled,  Endo/Other  diabetes (diet controlled), Well Controlled, Type 2Hypothyroidism   Renal/GU negative Renal ROS     Musculoskeletal   Abdominal   Peds  Hematology   Anesthesia Other Findings   Reproductive/Obstetrics                           Anesthesia Physical Anesthesia Plan  ASA: III  Anesthesia Plan: General   Post-op Pain Management:    Induction: Intravenous  Airway Management Planned: Oral ETT  Additional Equipment:   Intra-op Plan:   Post-operative Plan: Extubation in OR  Informed Consent: I have reviewed the patients History and Physical, chart, labs and discussed the procedure including the risks, benefits and alternatives for the proposed anesthesia with the patient or authorized representative who has indicated his/her understanding and acceptance.   Dental advisory given  Plan Discussed with: CRNA and Surgeon  Anesthesia Plan Comments: (Plan routine monitors, GETA)        Anesthesia Quick Evaluation

## 2013-01-28 NOTE — Anesthesia Postprocedure Evaluation (Signed)
  Anesthesia Post-op Note  Patient: Brianna [name]  Procedure(s) Performed: Procedure(s): LUMBAR LAMINECTOMY/DECOMPRESSION MICRODISCECTOMY LUMBAR FOUR-FIVE (Right)  Patient Location: PACU  Anesthesia Type:General  Level of Consciousness: awake, alert , oriented and patient cooperative  Airway and Oxygen Therapy: Patient Spontanous Breathing and Patient connected to nasal cannula oxygen  Post-op Pain: none  Post-op Assessment: Post-op Vital signs reviewed, Patient's Cardiovascular Status Stable, Respiratory Function Stable, Patent Airway, No signs of Nausea or vomiting and Pain level controlled  Post-op Vital Signs: Reviewed and stable  Complications: No apparent anesthesia complications

## 2013-01-28 NOTE — Plan of Care (Signed)
Problem: Consults Goal: Diagnosis - Spinal Surgery Outcome: Completed/Met Date Met:  01/28/13 Lumbar Laminectomy (Complex)

## 2013-01-28 NOTE — H&P (Signed)
Brianna [name] is an 67 y.o. female.   Chief Complaint: Right leg pain HPI: The patient is a 67 year old female who was evaluated for back pain which radiated down the right leg. She's had this time for a few months with no inciting event. The start of back pain after a few weeks went to urgent care. She also saw her primary medical Dr. the was seen by orthopedist. She's tried to amounts of oral steroids Robaxin 1 oxycodone physical therapy and chiropractic treatment all without relief. She then underwent epidural steroid shots which also gave her no help. After reviewing the MRI scan that showed a small disc abnormality at L4-5 on the right the patient requested surgery and now comes for a right L4-5 microdiscectomy. I had a long discussion with her regarding the risks and benefits of surgical intervention. The risks discussed include but are not limited to bleeding infection weakness numbness paralysis spinal fluid leak coma and death. We've discussed alternative methods of therapy although risks and benefits of nonintervention. She's had the opportunity to ask numerous questions and appears to understand. With this information in hand she has requested we proceed with surgery.  Past Medical History  Diagnosis Date  . Attention deficit disorder with hyperactivity(314.01)   . Other chronic cystitis   . Posttraumatic stress disorder     followed by Dr Ladona Ridgel, psychiatry  . Depressive disorder, not elsewhere classified   . Esophageal reflux   . Stricture and stenosis of esophagus   . Other and unspecified hyperlipidemia   . Unspecified hypothyroidism   . Insomnia   . IBS (irritable bowel syndrome)   . Osteoarthritis   . Migraine   . Obstructive sleep apnea   . Temporomandibular joint disorders, unspecified   . Thoracic spondylosis without myelopathy   . Acute colitis   . Panic attacks   . Osteoporosis   . HLD (hyperlipidemia)   . Complication of anesthesia     shaking after sugery in  the 1980's  . Dysrhythmia   . Diabetes mellitus      no longer a problem  diet controlled  . Kidney stones     Past Surgical History  Procedure Laterality Date  . Appendectomy    . Cholecystectomy    . Tubal ligation    . Bladder surgery    . Eye surgery    . Abdominal hysterectomy    . Tonsillectomy    . Colonoscopy      Family History  Problem Relation Age of Onset  . Heart attack Father   . Heart failure Father   . Liver disease Father   . Heart disease Father   . COPD Mother   . Depression Mother   . Diabetes Mother   . Crohn's disease Son    Social History:  reports that she has quit smoking. She has never used smokeless tobacco. She reports that she does not drink alcohol or use illicit drugs.  Allergies:  Allergies  Allergen Reactions  . Lactose Intolerance (Gi) Nausea And Vomiting  . Tetracycline Swelling    Medications Prior to Admission  Medication Sig Dispense Refill  . aspirin 81 MG tablet Take 81 mg by mouth daily.        Marland Kitchen atorvastatin (LIPITOR) 20 MG tablet Take 20 mg by mouth daily.        Marland Kitchen esomeprazole (NEXIUM) 40 MG capsule Take 40 mg by mouth daily before breakfast.      . folic acid (FOLVITE) 1 MG  tablet Take 1 mg by mouth daily.      . hydrOXYzine (ATARAX/VISTARIL) 10 MG tablet Take 10 mg by mouth every 4 (four) hours as needed for itching or anxiety.       Marland Kitchen levothyroxine (SYNTHROID, LEVOTHROID) 100 MCG tablet Take 50 mcg by mouth daily.       Marland Kitchen oxyCODONE-acetaminophen (PERCOCET/ROXICET) 5-325 MG per tablet Take 1 tablet by mouth every 4 (four) hours as needed for pain.      . promethazine (PHENERGAN) 25 MG tablet Take 25 mg by mouth every 6 (six) hours as needed for nausea.      . Vitamin D, Ergocalciferol, (DRISDOL) 50000 UNITS CAPS capsule Take 50,000 Units by mouth every 7 (seven) days.        No results found for this or any previous visit (from the past 48 hour(s)). No results found.  Review of systems is positive for wearing reading  glasses nasal congestion high cholesterol shortness of breath digestion nausea history of gastritis abdominal pain and incontinence anxiety and depression.  Blood pressure 126/49, pulse 84, temperature 97.4 F (36.3 C), temperature source Oral, resp. rate 18, SpO2 97.00%.  Exam shows the patient is awake alert and oriented. She has a decreased right ankle jerk. Strength is mildly decreased 6 extensors hallucis longus on the right. Assessment/Plan Impression is that of a herniated disc L4-5 in the right. The plan is for a right L4-5 microdiscectomy.  Reinaldo Meeker, MD 01/28/2013, 9:20 AM

## 2013-01-28 NOTE — Op Note (Signed)
Preop diagnosis: Herniated disc L4-5 right Postop diagnosis: Same Procedure: Right L4-5 intralaminar laminotomy for excision of herniated disc with operating microscope Surgeon: Makaiyah Schweiger Assistant: Yetta Barre  After and placed the prone position the patient's back was prepped and draped in usual sterile fashion. Localizing x-rays taken prior to incision to identify the appropriate level. Midline incision was made above the spinous processes of L4 and L5. Using Bovie cutting current the incision was carried to the spinous processes. Subperiosteal dissection was then carried out on the right side the spinous processes and lamina and self-retaining tract was placed for exposure. X-ray showed approach the appropriate level. Using the high-speed drill the inferior one third of the L4 lamina the medial one third of the facet joint and the superior one third of the L5 lamina were removed. Residual bone and ligamentum flavum removed in a piecemeal fashion. Microscope was draped brought in the field and used for the remainder of the case. Using microdissection technique the lateral aspect of the thecal sac and L5 nerve were identified. Further coagulation was carried out down before the canal to identify the L4-5 disc which have any focal herniated beneath the nerve root. After coagulating on Indocin as was incised a 15 blade. Using pituitary rongeurs and curettes thorough displaced cleanout was carried out while the same time great care was taken to avoid injury to the neural elements this was successfully done. At this point inspection was carried out all directions any evidence of residual compression and none could be identified. Irrigation was carried out and any bleeding control proper coagulation Gelfoam. The was then closed in multiple layers of Vicryl on the muscle fascia subcutaneous and subcuticular tissues. Dermabond and Steri-Strips were placed on the skin. Shortness was then applied and the patient was  extubated and taken to cut them in stable condition.

## 2013-01-29 ENCOUNTER — Encounter (HOSPITAL_COMMUNITY): Payer: Self-pay | Admitting: Neurosurgery

## 2013-01-29 LAB — GLUCOSE, CAPILLARY
Glucose-Capillary: 152 mg/dL — ABNORMAL HIGH (ref 70–99)
Glucose-Capillary: 126 mg/dL — ABNORMAL HIGH (ref 70–99)

## 2013-01-29 MED ORDER — OXYCODONE-ACETAMINOPHEN 5-325 MG PO TABS: 1.0000 | ORAL_TABLET | ORAL | Status: DC | PRN

## 2013-01-29 NOTE — Progress Notes (Signed)
Pt. Alert and oriented,follows simple instructions, denies pain. Incision area without swelling, redness or S/S of infection. Voiding adequate clear yellow urine. Moving all extremities well and vitals stable and documented. Patient discharged home with spouse. Lumbar surgery notes instructions given to patient and family member for home safety and precautions. Pt. and family stated understanding of instructions given. Pain medication given to patient prior to discharged for pain.

## 2013-01-29 NOTE — Discharge Summary (Signed)
  Physician Discharge Summary  Patient ID: Brianna [name] MRN: [id number] DOB/AGE: 1945/04/28 67 y.o.  Admit date: 01/28/2013 Discharge date: 01/29/2013  Admission Diagnoses:  Discharge Diagnoses:  Active Problems:   * No active hospital problems. *   Discharged Condition: good  Hospital Course: Surgery one day for herniated lumbar disc. Did great. Home next day doing well. Specific instructions given. Wound fine.  Consults: None  Significant Diagnostic Studies: none  Treatments: surgery: L 45 microdiscectomy  Discharge Exam: Blood pressure 120/75, pulse 106, temperature 98 F (36.7 C), temperature source Oral, resp. rate 20, SpO2 94.00%. Incision/Wound:clean and dry; no new neuro issues  Disposition: 01-Home or Self Care     Medication List    ASK your doctor about these medications       aspirin 81 MG tablet  Take 81 mg by mouth daily.     atorvastatin 20 MG tablet  Commonly known as:  LIPITOR  Take 20 mg by mouth daily.     esomeprazole 40 MG capsule  Commonly known as:  NEXIUM  Take 40 mg by mouth daily before breakfast.     folic acid 1 MG tablet  Commonly known as:  FOLVITE  Take 1 mg by mouth daily.     hydrOXYzine 10 MG tablet  Commonly known as:  ATARAX/VISTARIL  Take 10 mg by mouth every 4 (four) hours as needed for itching or anxiety.     levothyroxine 100 MCG tablet  Commonly known as:  SYNTHROID, LEVOTHROID  Take 50 mcg by mouth daily.     oxyCODONE-acetaminophen 5-325 MG per tablet  Commonly known as:  PERCOCET/ROXICET  Take 1 tablet by mouth every 4 (four) hours as needed for pain.     promethazine 25 MG tablet  Commonly known as:  PHENERGAN  Take 25 mg by mouth every 6 (six) hours as needed for nausea.     Vitamin D (Ergocalciferol) 50000 UNITS Caps capsule  Commonly known as:  DRISDOL  Take 50,000 Units by mouth every 7 (seven) days.         At home rest most of the time. Get up 9 or 10 times each day and take a 15 or 20  minute walk. No riding in the car and to your first postoperative appointment. If you have neck surgery you may shower from the chest down starting on the third postoperative day. If you had back surgery he may start showering on the third postoperative day with saran wrap wrapped around your incisional area 3 times. After the shower remove the saran wrap. Take pain medicine as needed and other medications as instructed. Call my office for an appointment.  SignedReinaldo Meeker, MD 01/29/2013, 10:22 AM

## 2013-02-15 DIAGNOSIS — F09 Unspecified mental disorder due to known physiological condition: Secondary | ICD-10-CM | POA: Insufficient documentation

## 2013-05-17 DIAGNOSIS — F339 Major depressive disorder, recurrent, unspecified: Secondary | ICD-10-CM | POA: Insufficient documentation

## 2013-10-06 ENCOUNTER — Other Ambulatory Visit: Payer: Self-pay | Admitting: Neurosurgery

## 2013-10-06 DIAGNOSIS — M5126 Other intervertebral disc displacement, lumbar region: Secondary | ICD-10-CM

## 2013-10-14 ENCOUNTER — Ambulatory Visit
Admission: RE | Admit: 2013-10-14 | Discharge: 2013-10-14 | Disposition: A | Discharge status: Home / Self Care | Payer: Medicare Other | Source: Ambulatory Visit | Attending: Neurosurgery | Admitting: Neurosurgery

## 2013-10-14 VITALS — BP 119/46 | HR 71

## 2013-10-14 DIAGNOSIS — M5126 Other intervertebral disc displacement, lumbar region: Secondary | ICD-10-CM

## 2013-10-14 MED ORDER — IOHEXOL 180 MG/ML  SOLN
15.0000 mL | Freq: Once | INTRAMUSCULAR | Status: AC | PRN
  Administered 2013-10-14: 15 mL via INTRATHECAL

## 2013-10-14 MED ORDER — DIAZEPAM 5 MG PO TABS
5.0000 mg | ORAL_TABLET | Freq: Once | ORAL | Status: AC
  Administered 2013-10-14: 5 mg via ORAL

## 2013-10-14 NOTE — Discharge Instructions (Signed)

## 2013-11-08 ENCOUNTER — Other Ambulatory Visit: Payer: Self-pay | Admitting: Neurosurgery

## 2013-11-12 ENCOUNTER — Encounter (HOSPITAL_COMMUNITY): Payer: Self-pay | Admitting: Pharmacy Technician

## 2013-11-17 ENCOUNTER — Encounter (HOSPITAL_COMMUNITY)
Admission: RE | Admit: 2013-11-17 | Discharge: 2013-11-17 | Disposition: A | Discharge status: Home / Self Care | Payer: Medicare Other | Source: Ambulatory Visit | Attending: Neurosurgery | Admitting: Neurosurgery

## 2013-11-17 ENCOUNTER — Encounter (HOSPITAL_COMMUNITY): Payer: Self-pay

## 2013-11-17 DIAGNOSIS — Z01812 Encounter for preprocedural laboratory examination: Secondary | ICD-10-CM | POA: Diagnosis not present

## 2013-11-17 LAB — BASIC METABOLIC PANEL
Anion gap: 13 (ref 5–15)
BUN: 15 mg/dL (ref 6–23)
CO2: 23 mEq/L (ref 19–32)
Calcium: 9.4 mg/dL (ref 8.4–10.5)
Chloride: 100 mEq/L (ref 96–112)
Creatinine, Ser: 0.9 mg/dL (ref 0.50–1.10)
GFR calc Af Amer: 74 mL/min — ABNORMAL LOW (ref >90)
GFR calc non Af Amer: 64 mL/min — ABNORMAL LOW (ref >90)
Glucose, Bld: 94 mg/dL (ref 70–99)
Potassium: 5.1 mEq/L (ref 3.7–5.3)
Sodium: 136 mEq/L — ABNORMAL LOW (ref 137–147)

## 2013-11-17 LAB — CBC
HCT: 41.1 % (ref 36.0–46.0)
Hemoglobin: 13.5 g/dL (ref 12.0–15.0)
MCH: 29.8 pg (ref 26.0–34.0)
MCHC: 32.8 g/dL (ref 30.0–36.0)
MCV: 90.7 fL (ref 78.0–100.0)
Platelets: 300 10*3/uL (ref 150–400)
RBC: 4.53 MIL/uL (ref 3.87–5.11)
RDW: 14.1 % (ref 11.5–15.5)
WBC: 8.7 10*3/uL (ref 4.0–10.5)

## 2013-11-17 LAB — SURGICAL PCR SCREEN
MRSA, PCR: NEGATIVE
Staphylococcus aureus: NEGATIVE

## 2013-11-17 LAB — TYPE AND SCREEN
ABO/RH(D): O POS
Antibody Screen: NEGATIVE

## 2013-11-17 LAB — ABO/RH: ABO/RH(D): O POS

## 2013-11-17 NOTE — Pre-Procedure Instructions (Signed)
Brianna [name]  11/17/2013   Your procedure is scheduled on:  Thursday August 20,2015   Report to [facility] Admitting at 10:20 AM.   Call this number if you have problems the morning of surgery: [phone]   Remember:   Do not eat food or drink liquids after midnight.    Take these medicines the morning of surgery with A SIP OF WATER: pain medication if needed,levothyroxine(Synthroid),namenda,omeprazole,   Do not wear jewelry, make-up or nail polish.  Do not wear lotions, powders, or perfumes. You may not wear deodorant.  Do not shave 48 hours prior to surgery. Men may shave face and neck.  Do not bring valuables to the hospital.  [facility] is not responsible  for any belongings or valuables.               Contacts, dentures or bridgework may not be worn into surgery.   Leave suitcase in the car. After surgery it may be brought to your room.  For patients admitted to the hospital, discharge time is determined by your treatment team.      Special Instructions: See attached Sheet for instructions of CHG shower/bath      Please read over the following fact sheets that you were given: Pain Booklet, Coughing and Deep Breathing, Blood Transfusion Information and Surgical Site Infection Prevention

## 2013-11-24 MED ORDER — DEXAMETHASONE SODIUM PHOSPHATE 10 MG/ML IJ SOLN
10.0000 mg | INTRAMUSCULAR | Status: AC
  Administered 2013-11-25: 10 mg via INTRAVENOUS
  Filled 2013-11-24: qty 1

## 2013-11-24 MED ORDER — CEFAZOLIN SODIUM-DEXTROSE 2-3 GM-% IV SOLR
2.0000 g | INTRAVENOUS | Status: AC
  Administered 2013-11-25: 2 g via INTRAVENOUS
  Filled 2013-11-24: qty 50

## 2013-11-25 ENCOUNTER — Inpatient Hospital Stay (HOSPITAL_COMMUNITY)
Admission: RE | Admit: 2013-11-25 | Discharge: 2013-11-26 | DRG: 460 | Disposition: A | Discharge status: Home / Self Care | Payer: Medicare Other | Source: Ambulatory Visit | Attending: Neurosurgery | Admitting: Neurosurgery

## 2013-11-25 ENCOUNTER — Encounter (HOSPITAL_COMMUNITY): Payer: Self-pay | Admitting: Anesthesiology

## 2013-11-25 ENCOUNTER — Encounter (HOSPITAL_COMMUNITY): Payer: Medicare Other | Admitting: Anesthesiology

## 2013-11-25 ENCOUNTER — Inpatient Hospital Stay (HOSPITAL_COMMUNITY): Payer: Medicare Other

## 2013-11-25 ENCOUNTER — Encounter (HOSPITAL_COMMUNITY)
Admission: RE | Disposition: A | Discharge status: Home / Self Care | Payer: Self-pay | Source: Ambulatory Visit | Attending: Neurosurgery

## 2013-11-25 ENCOUNTER — Inpatient Hospital Stay (HOSPITAL_COMMUNITY): Payer: Medicare Other | Admitting: Anesthesiology

## 2013-11-25 DIAGNOSIS — Z836 Family history of other diseases of the respiratory system: Secondary | ICD-10-CM | POA: Diagnosis not present

## 2013-11-25 DIAGNOSIS — Z7982 Long term (current) use of aspirin: Secondary | ICD-10-CM | POA: Diagnosis not present

## 2013-11-25 DIAGNOSIS — Z79899 Other long term (current) drug therapy: Secondary | ICD-10-CM | POA: Diagnosis not present

## 2013-11-25 DIAGNOSIS — M810 Age-related osteoporosis without current pathological fracture: Secondary | ICD-10-CM | POA: Diagnosis present

## 2013-11-25 DIAGNOSIS — M5126 Other intervertebral disc displacement, lumbar region: Principal | ICD-10-CM | POA: Diagnosis present

## 2013-11-25 DIAGNOSIS — E785 Hyperlipidemia, unspecified: Secondary | ICD-10-CM | POA: Diagnosis present

## 2013-11-25 DIAGNOSIS — E739 Lactose intolerance, unspecified: Secondary | ICD-10-CM | POA: Diagnosis present

## 2013-11-25 DIAGNOSIS — Z9089 Acquired absence of other organs: Secondary | ICD-10-CM | POA: Diagnosis not present

## 2013-11-25 DIAGNOSIS — G4733 Obstructive sleep apnea (adult) (pediatric): Secondary | ICD-10-CM | POA: Diagnosis present

## 2013-11-25 DIAGNOSIS — F3289 Other specified depressive episodes: Secondary | ICD-10-CM | POA: Diagnosis present

## 2013-11-25 DIAGNOSIS — Z818 Family history of other mental and behavioral disorders: Secondary | ICD-10-CM | POA: Diagnosis not present

## 2013-11-25 DIAGNOSIS — F410 Panic disorder [episodic paroxysmal anxiety] without agoraphobia: Secondary | ICD-10-CM | POA: Diagnosis present

## 2013-11-25 DIAGNOSIS — M1990 Unspecified osteoarthritis, unspecified site: Secondary | ICD-10-CM | POA: Diagnosis present

## 2013-11-25 DIAGNOSIS — Z8249 Family history of ischemic heart disease and other diseases of the circulatory system: Secondary | ICD-10-CM

## 2013-11-25 DIAGNOSIS — Z9851 Tubal ligation status: Secondary | ICD-10-CM | POA: Diagnosis not present

## 2013-11-25 DIAGNOSIS — Z87442 Personal history of urinary calculi: Secondary | ICD-10-CM

## 2013-11-25 DIAGNOSIS — Z833 Family history of diabetes mellitus: Secondary | ICD-10-CM

## 2013-11-25 DIAGNOSIS — Z87891 Personal history of nicotine dependence: Secondary | ICD-10-CM | POA: Diagnosis not present

## 2013-11-25 DIAGNOSIS — F4310 Post-traumatic stress disorder, unspecified: Secondary | ICD-10-CM | POA: Diagnosis present

## 2013-11-25 DIAGNOSIS — F329 Major depressive disorder, single episode, unspecified: Secondary | ICD-10-CM | POA: Diagnosis present

## 2013-11-25 DIAGNOSIS — F909 Attention-deficit hyperactivity disorder, unspecified type: Secondary | ICD-10-CM | POA: Diagnosis present

## 2013-11-25 DIAGNOSIS — E039 Hypothyroidism, unspecified: Secondary | ICD-10-CM | POA: Diagnosis present

## 2013-11-25 DIAGNOSIS — K219 Gastro-esophageal reflux disease without esophagitis: Secondary | ICD-10-CM | POA: Diagnosis present

## 2013-11-25 DIAGNOSIS — Z881 Allergy status to other antibiotic agents status: Secondary | ICD-10-CM

## 2013-11-25 DIAGNOSIS — M545 Low back pain, unspecified: Secondary | ICD-10-CM | POA: Diagnosis present

## 2013-11-25 HISTORY — PX: TRANSFORAMINAL LUMBAR INTERBODY FUSION (TLIF) WITH PEDICLE SCREW FIXATION 1 LEVEL: SHX6141

## 2013-11-25 SURGERY — TRANSFORAMINAL LUMBAR INTERBODY FUSION (TLIF) WITH PEDICLE SCREW FIXATION 1 LEVEL
Anesthesia: General | Site: Back | Laterality: Right

## 2013-11-25 MED ORDER — PANTOPRAZOLE SODIUM 40 MG PO TBEC
40.0000 mg | DELAYED_RELEASE_TABLET | Freq: Every day | ORAL | Status: DC
  Administered 2013-11-25: 40 mg via ORAL
  Filled 2013-11-25: qty 1

## 2013-11-25 MED ORDER — DEXAMETHASONE SODIUM PHOSPHATE 4 MG/ML IJ SOLN: 4.0000 mg | Freq: Four times a day (QID) | INTRAMUSCULAR | Status: AC

## 2013-11-25 MED ORDER — MIDAZOLAM HCL 2 MG/2ML IJ SOLN
INTRAMUSCULAR | Status: AC
  Filled 2013-11-25: qty 2

## 2013-11-25 MED ORDER — ONDANSETRON HCL 4 MG/2ML IJ SOLN: 4.0000 mg | INTRAMUSCULAR | Status: DC | PRN

## 2013-11-25 MED ORDER — ONDANSETRON HCL 4 MG/2ML IJ SOLN
INTRAMUSCULAR | Status: DC | PRN
  Administered 2013-11-25: 4 mg via INTRAVENOUS

## 2013-11-25 MED ORDER — PHENOL 1.4 % MT LIQD: 1.0000 | OROMUCOSAL | Status: DC | PRN

## 2013-11-25 MED ORDER — BUPIVACAINE HCL (PF) 0.5 % IJ SOLN
INTRAMUSCULAR | Status: DC | PRN
  Administered 2013-11-25: 20 mL
  Administered 2013-11-25: 10 mL

## 2013-11-25 MED ORDER — LEVOTHYROXINE SODIUM 50 MCG PO TABS
50.0000 ug | ORAL_TABLET | Freq: Every day | ORAL | Status: DC
  Administered 2013-11-26: 50 ug via ORAL
  Filled 2013-11-25 (×2): qty 1

## 2013-11-25 MED ORDER — CYCLOBENZAPRINE HCL 10 MG PO TABS
10.0000 mg | ORAL_TABLET | Freq: Three times a day (TID) | ORAL | Status: DC | PRN
  Administered 2013-11-25 – 2013-11-26 (×2): 10 mg via ORAL
  Filled 2013-11-25 (×3): qty 1

## 2013-11-25 MED ORDER — PANTOPRAZOLE SODIUM 40 MG IV SOLR
40.0000 mg | Freq: Every day | INTRAVENOUS | Status: DC
  Filled 2013-11-25: qty 40

## 2013-11-25 MED ORDER — SIMVASTATIN 5 MG PO TABS
5.0000 mg | ORAL_TABLET | Freq: Every day | ORAL | Status: DC
  Administered 2013-11-25: 5 mg via ORAL
  Filled 2013-11-25 (×2): qty 1

## 2013-11-25 MED ORDER — ROCURONIUM BROMIDE 50 MG/5ML IV SOLN
INTRAVENOUS | Status: AC
  Filled 2013-11-25: qty 1

## 2013-11-25 MED ORDER — LIDOCAINE HCL (CARDIAC) 20 MG/ML IV SOLN
INTRAVENOUS | Status: AC
  Filled 2013-11-25: qty 5

## 2013-11-25 MED ORDER — DOCUSATE SODIUM 100 MG PO CAPS
100.0000 mg | ORAL_CAPSULE | Freq: Two times a day (BID) | ORAL | Status: DC
  Administered 2013-11-25: 100 mg via ORAL
  Filled 2013-11-25 (×3): qty 1

## 2013-11-25 MED ORDER — MIDAZOLAM HCL 5 MG/5ML IJ SOLN
INTRAMUSCULAR | Status: DC | PRN
  Administered 2013-11-25: 2 mg via INTRAVENOUS

## 2013-11-25 MED ORDER — OXYCODONE HCL 5 MG PO TABS
5.0000 mg | ORAL_TABLET | Freq: Once | ORAL | Status: AC | PRN
  Administered 2013-11-25: 5 mg via ORAL

## 2013-11-25 MED ORDER — LACTATED RINGERS IV SOLN
INTRAVENOUS | Status: DC | PRN
  Administered 2013-11-25 (×3): via INTRAVENOUS

## 2013-11-25 MED ORDER — LACTATED RINGERS IV SOLN
INTRAVENOUS | Status: DC
  Administered 2013-11-25: 11:00:00 via INTRAVENOUS

## 2013-11-25 MED ORDER — VANCOMYCIN HCL 1000 MG IV SOLR
INTRAVENOUS | Status: AC
  Filled 2013-11-25: qty 1000

## 2013-11-25 MED ORDER — LIDOCAINE HCL (CARDIAC) 20 MG/ML IV SOLN
INTRAVENOUS | Status: DC | PRN
  Administered 2013-11-25: 80 mg via INTRAVENOUS

## 2013-11-25 MED ORDER — HYDROMORPHONE HCL PF 1 MG/ML IJ SOLN
INTRAMUSCULAR | Status: AC
  Filled 2013-11-25: qty 1

## 2013-11-25 MED ORDER — MENTHOL 3 MG MT LOZG: 1.0000 | LOZENGE | OROMUCOSAL | Status: DC | PRN

## 2013-11-25 MED ORDER — MEMANTINE HCL 10 MG PO TABS
10.0000 mg | ORAL_TABLET | Freq: Every day | ORAL | Status: DC
  Filled 2013-11-25: qty 1

## 2013-11-25 MED ORDER — SODIUM CHLORIDE 0.9 % IJ SOLN: 3.0000 mL | Freq: Two times a day (BID) | INTRAMUSCULAR | Status: DC

## 2013-11-25 MED ORDER — DEXAMETHASONE 4 MG PO TABS
4.0000 mg | ORAL_TABLET | Freq: Four times a day (QID) | ORAL | Status: AC
  Administered 2013-11-25 – 2013-11-26 (×2): 4 mg via ORAL
  Filled 2013-11-25: qty 1

## 2013-11-25 MED ORDER — CEFAZOLIN SODIUM-DEXTROSE 2-3 GM-% IV SOLR
2.0000 g | Freq: Three times a day (TID) | INTRAVENOUS | Status: AC
  Administered 2013-11-25 – 2013-11-26 (×2): 2 g via INTRAVENOUS
  Filled 2013-11-25 (×2): qty 50

## 2013-11-25 MED ORDER — GLYCOPYRROLATE 0.2 MG/ML IJ SOLN
INTRAMUSCULAR | Status: AC
  Filled 2013-11-25: qty 2

## 2013-11-25 MED ORDER — OXYCODONE HCL 5 MG PO TABS
ORAL_TABLET | ORAL | Status: AC
  Filled 2013-11-25: qty 1

## 2013-11-25 MED ORDER — SODIUM CHLORIDE 0.9 % IJ SOLN: 3.0000 mL | INTRAMUSCULAR | Status: DC | PRN

## 2013-11-25 MED ORDER — ACETAMINOPHEN 325 MG PO TABS: 650.0000 mg | ORAL_TABLET | ORAL | Status: DC | PRN

## 2013-11-25 MED ORDER — HYDROMORPHONE HCL PF 1 MG/ML IJ SOLN
0.2500 mg | INTRAMUSCULAR | Status: DC | PRN
  Administered 2013-11-25 (×4): 0.5 mg via INTRAVENOUS

## 2013-11-25 MED ORDER — KCL IN DEXTROSE-NACL 20-5-0.45 MEQ/L-%-% IV SOLN
80.0000 mL/h | INTRAVENOUS | Status: DC
  Filled 2013-11-25 (×3): qty 1000

## 2013-11-25 MED ORDER — ACETAMINOPHEN 650 MG RE SUPP: 650.0000 mg | RECTAL | Status: DC | PRN

## 2013-11-25 MED ORDER — HYDROXYZINE HCL 10 MG PO TABS
10.0000 mg | ORAL_TABLET | ORAL | Status: DC | PRN
  Filled 2013-11-25: qty 1

## 2013-11-25 MED ORDER — PROPOFOL 10 MG/ML IV BOLUS
INTRAVENOUS | Status: AC
  Filled 2013-11-25: qty 20

## 2013-11-25 MED ORDER — 0.9 % SODIUM CHLORIDE (POUR BTL) OPTIME
TOPICAL | Status: DC | PRN
  Administered 2013-11-25: 1000 mL

## 2013-11-25 MED ORDER — NEOSTIGMINE METHYLSULFATE 10 MG/10ML IV SOLN
INTRAVENOUS | Status: DC | PRN
  Administered 2013-11-25: 3 mg via INTRAVENOUS

## 2013-11-25 MED ORDER — GLYCOPYRROLATE 0.2 MG/ML IJ SOLN
INTRAMUSCULAR | Status: DC | PRN
  Administered 2013-11-25: 0.4 mg via INTRAVENOUS

## 2013-11-25 MED ORDER — SODIUM CHLORIDE 0.9 % IR SOLN
Status: DC | PRN
  Administered 2013-11-25: 14:00:00

## 2013-11-25 MED ORDER — PHENYLEPHRINE 40 MCG/ML (10ML) SYRINGE FOR IV PUSH (FOR BLOOD PRESSURE SUPPORT)
PREFILLED_SYRINGE | INTRAVENOUS | Status: AC
  Filled 2013-11-25: qty 10

## 2013-11-25 MED ORDER — ARTIFICIAL TEARS OP OINT
TOPICAL_OINTMENT | OPHTHALMIC | Status: DC | PRN
  Administered 2013-11-25: 1 via OPHTHALMIC

## 2013-11-25 MED ORDER — FENTANYL CITRATE 0.05 MG/ML IJ SOLN
INTRAMUSCULAR | Status: DC | PRN
  Administered 2013-11-25 (×2): 50 ug via INTRAVENOUS
  Administered 2013-11-25: 100 ug via INTRAVENOUS
  Administered 2013-11-25: 50 ug via INTRAVENOUS

## 2013-11-25 MED ORDER — SODIUM CHLORIDE 0.9 % IV SOLN: 250.0000 mL | INTRAVENOUS | Status: DC

## 2013-11-25 MED ORDER — PROPOFOL 10 MG/ML IV BOLUS
INTRAVENOUS | Status: DC | PRN
  Administered 2013-11-25: 150 mg via INTRAVENOUS

## 2013-11-25 MED ORDER — OXYCODONE HCL 5 MG/5ML PO SOLN: 5.0000 mg | Freq: Once | ORAL | Status: AC | PRN

## 2013-11-25 MED ORDER — FENTANYL CITRATE 0.05 MG/ML IJ SOLN
INTRAMUSCULAR | Status: AC
  Filled 2013-11-25: qty 5

## 2013-11-25 MED ORDER — NEOSTIGMINE METHYLSULFATE 10 MG/10ML IV SOLN
INTRAVENOUS | Status: AC
  Filled 2013-11-25: qty 1

## 2013-11-25 MED ORDER — PHENYLEPHRINE HCL 10 MG/ML IJ SOLN
INTRAMUSCULAR | Status: DC | PRN
  Administered 2013-11-25 (×4): 80 ug via INTRAVENOUS
  Administered 2013-11-25: 40 ug via INTRAVENOUS
  Administered 2013-11-25 (×3): 80 ug via INTRAVENOUS

## 2013-11-25 MED ORDER — VANCOMYCIN HCL 1000 MG IV SOLR
INTRAVENOUS | Status: DC | PRN
  Administered 2013-11-25: 1000 mg

## 2013-11-25 MED ORDER — SURGIFOAM 100 EX MISC
CUTANEOUS | Status: DC | PRN
  Administered 2013-11-25: 14:00:00 via TOPICAL

## 2013-11-25 MED ORDER — ONDANSETRON HCL 4 MG/2ML IJ SOLN
INTRAMUSCULAR | Status: AC
  Filled 2013-11-25: qty 2

## 2013-11-25 MED ORDER — OXYCODONE-ACETAMINOPHEN 5-325 MG PO TABS
1.0000 | ORAL_TABLET | ORAL | Status: DC | PRN
  Administered 2013-11-25 – 2013-11-26 (×3): 2 via ORAL
  Filled 2013-11-25 (×3): qty 2

## 2013-11-25 MED ORDER — ROCURONIUM BROMIDE 100 MG/10ML IV SOLN
INTRAVENOUS | Status: DC | PRN
  Administered 2013-11-25: 50 mg via INTRAVENOUS

## 2013-11-25 MED ORDER — HYDROMORPHONE HCL PF 1 MG/ML IJ SOLN
1.0000 mg | INTRAMUSCULAR | Status: DC | PRN
  Administered 2013-11-25 (×2): 1 mg via INTRAMUSCULAR
  Filled 2013-11-25: qty 1

## 2013-11-25 MED ORDER — PROMETHAZINE HCL 25 MG/ML IJ SOLN: 6.2500 mg | INTRAMUSCULAR | Status: DC | PRN

## 2013-11-25 SURGICAL SUPPLY — 66 items
BAG DECANTER FOR FLEXI CONT (MISCELLANEOUS) ×2 IMPLANT
BENZOIN TINCTURE PRP APPL 2/3 (GAUZE/BANDAGES/DRESSINGS) ×2 IMPLANT
BLADE SURG ROTATE 9660 (MISCELLANEOUS) ×2 IMPLANT
BONE EQUIVA 5CC (Bone Implant) ×2 IMPLANT
BRUSH SCRUB EZ PLAIN DRY (MISCELLANEOUS) ×2 IMPLANT
CANISTER SUCT 3000ML (MISCELLANEOUS) ×2 IMPLANT
CONT SPEC 4OZ CLIKSEAL STRL BL (MISCELLANEOUS) ×4 IMPLANT
CORDS BIPOLAR (ELECTRODE) ×2 IMPLANT
COVER BACK TABLE 24X17X13 BIG (DRAPES) IMPLANT
COVER TABLE BACK 60X90 (DRAPES) ×2 IMPLANT
DERMABOND ADVANCED (GAUZE/BANDAGES/DRESSINGS) ×1
DERMABOND ADVANCED .7 DNX12 (GAUZE/BANDAGES/DRESSINGS) ×1 IMPLANT
DRAPE C-ARM 42X72 X-RAY (DRAPES) ×4 IMPLANT
DRAPE LAPAROTOMY 100X72X124 (DRAPES) ×2 IMPLANT
DRAPE POUCH INSTRU U-SHP 10X18 (DRAPES) ×2 IMPLANT
DRAPE SURG 17X23 STRL (DRAPES) ×4 IMPLANT
DRSG OPSITE POSTOP 4X6 (GAUZE/BANDAGES/DRESSINGS) ×2 IMPLANT
DRSG TELFA 3X8 NADH (GAUZE/BANDAGES/DRESSINGS) ×2 IMPLANT
ELECT BLADE 4.0 EZ CLEAN MEGAD (MISCELLANEOUS) ×2
ELECT REM PT RETURN 9FT ADLT (ELECTROSURGICAL) ×2
ELECTRODE BLDE 4.0 EZ CLN MEGD (MISCELLANEOUS) ×1 IMPLANT
ELECTRODE REM PT RTRN 9FT ADLT (ELECTROSURGICAL) ×1 IMPLANT
EVACUATOR 1/8 PVC DRAIN (DRAIN) ×2 IMPLANT
GAUZE SPONGE 4X4 12PLY STRL (GAUZE/BANDAGES/DRESSINGS) ×2 IMPLANT
GAUZE SPONGE 4X4 16PLY XRAY LF (GAUZE/BANDAGES/DRESSINGS) IMPLANT
GLOVE BIO SURGEON STRL SZ8 (GLOVE) ×2 IMPLANT
GLOVE BIOGEL PI IND STRL 7.5 (GLOVE) ×1 IMPLANT
GLOVE BIOGEL PI IND STRL 8.5 (GLOVE) ×1 IMPLANT
GLOVE BIOGEL PI INDICATOR 7.5 (GLOVE) ×1
GLOVE BIOGEL PI INDICATOR 8.5 (GLOVE) ×1
GLOVE ECLIPSE 7.5 STRL STRAW (GLOVE) ×6 IMPLANT
GLOVE ECLIPSE 8.0 STRL XLNG CF (GLOVE) ×4 IMPLANT
GOWN STRL REUS W/ TWL LRG LVL3 (GOWN DISPOSABLE) IMPLANT
GOWN STRL REUS W/ TWL XL LVL3 (GOWN DISPOSABLE) ×3 IMPLANT
GOWN STRL REUS W/TWL 2XL LVL3 (GOWN DISPOSABLE) IMPLANT
GOWN STRL REUS W/TWL LRG LVL3 (GOWN DISPOSABLE)
GOWN STRL REUS W/TWL XL LVL3 (GOWN DISPOSABLE) ×3
KIT BASIN OR (CUSTOM PROCEDURE TRAY) ×2 IMPLANT
KIT ROOM TURNOVER OR (KITS) ×2 IMPLANT
NEEDLE HYPO 22GX1.5 SAFETY (NEEDLE) ×2 IMPLANT
NS IRRIG 1000ML POUR BTL (IV SOLUTION) ×2 IMPLANT
PACK LAMINECTOMY NEURO (CUSTOM PROCEDURE TRAY) ×2 IMPLANT
PAD ARMBOARD 7.5X6 YLW CONV (MISCELLANEOUS) ×6 IMPLANT
PATTIES SURGICAL .75X.75 (GAUZE/BANDAGES/DRESSINGS) ×2 IMPLANT
PEDIGUARD CURV (INSTRUMENTS) ×2 IMPLANT
PEEK ARDIS OPTIMA 9X11X30MM (Peek) ×2 IMPLANT
PUTTY BONE GRAFT KIT 2.5ML (Bone Implant) ×2 IMPLANT
ROD PERC 30MM (Rod) ×2 IMPLANT
SCREW POLYAXIA MIS 6.5X40MM (Screw) ×4 IMPLANT
SPONGE LAP 4X18 X RAY DECT (DISPOSABLE) IMPLANT
SPONGE SURGIFOAM ABS GEL 100 (HEMOSTASIS) ×2 IMPLANT
STAPLER SKIN PROX WIDE 3.9 (STAPLE) ×2 IMPLANT
STRIP CLOSURE SKIN 1/2X4 (GAUZE/BANDAGES/DRESSINGS) ×2 IMPLANT
SUT VIC AB 0 CT1 18XCR BRD8 (SUTURE) ×1 IMPLANT
SUT VIC AB 0 CT1 8-18 (SUTURE) ×1
SUT VIC AB 2-0 OS6 18 (SUTURE) ×6 IMPLANT
SUT VIC AB 3-0 CP2 18 (SUTURE) ×2 IMPLANT
SYR 20ML ECCENTRIC (SYRINGE) ×2 IMPLANT
TOOL FLUTED BALL 7MM (MISCELLANEOUS) ×2 IMPLANT
TOOL MATCHSTK 3MM (MISCELLANEOUS) ×2 IMPLANT
TOP CLSR SEQUOIA (Orthopedic Implant) ×4 IMPLANT
TOWEL OR 17X24 6PK STRL BLUE (TOWEL DISPOSABLE) ×2 IMPLANT
TOWEL OR 17X26 10 PK STRL BLUE (TOWEL DISPOSABLE) ×2 IMPLANT
TRAP SPECIMEN MUCOUS 40CC (MISCELLANEOUS) IMPLANT
TRAY FOLEY CATH 14FRSI W/METER (CATHETERS) ×2 IMPLANT
WATER STERILE IRR 1000ML POUR (IV SOLUTION) ×2 IMPLANT

## 2013-11-25 NOTE — Anesthesia Postprocedure Evaluation (Signed)
Anesthesia Post Note  Patient: Brianna [name]  Procedure(s) Performed: Procedure(s) (LRB): TRANSFORAMINAL LUMBAR INTERBODY FUSION (TLIF) WITH PEDICLE SCREW FIXATION 1 LEVEL (Right)  Anesthesia type: general  Patient location: PACU  Post pain: Pain level controlled  Post assessment: Patient's Cardiovascular Status Stable  Last Vitals:  Filed Vitals:   11/25/13 1608  BP: 117/61  Pulse: 80  Temp:   Resp: 19    Post vital signs: Reviewed and stable  Level of consciousness: sedated  Complications: No apparent anesthesia complications

## 2013-11-25 NOTE — Anesthesia Preprocedure Evaluation (Addendum)
Anesthesia Evaluation  Patient identified by MRN, date of birth, ID band Patient awake    Reviewed: Allergy & Precautions, H&P , NPO status , Patient's Chart, lab work & pertinent test results  Airway Mallampati: II TM Distance: <3 FB Neck ROM: Full  Mouth opening: Limited Mouth Opening  Dental  (+) Teeth Intact, Caps, Dental Advisory Given   Pulmonary sleep apnea , former smoker,  breath sounds clear to auscultation        Cardiovascular + dysrhythmias Rhythm:Regular Rate:Normal     Neuro/Psych  Headaches, PSYCHIATRIC DISORDERS Anxiety Depression    GI/Hepatic Neg liver ROS, GERD-  Medicated and Controlled,  Endo/Other  Hypothyroidism   Renal/GU negative Renal ROS     Musculoskeletal   Abdominal   Peds  Hematology   Anesthesia Other Findings   Reproductive/Obstetrics negative OB ROS                        Anesthesia Physical Anesthesia Plan  ASA: II  Anesthesia Plan: General   Post-op Pain Management:    Induction: Intravenous  Airway Management Planned: Oral ETT  Additional Equipment:   Intra-op Plan:   Post-operative Plan: Extubation in OR  Informed Consent: I have reviewed the patients History and Physical, chart, labs and discussed the procedure including the risks, benefits and alternatives for the proposed anesthesia with the patient or authorized representative who has indicated his/her understanding and acceptance.   Dental advisory given  Plan Discussed with: CRNA and Surgeon  Anesthesia Plan Comments:         Anesthesia Quick Evaluation

## 2013-11-25 NOTE — Progress Notes (Signed)
On hold for 3c.

## 2013-11-25 NOTE — Op Note (Signed)
Preop diagnosis: Herniated disc L4-5 right, recurrent Postop diagnosis: Same Procedure: Right L4-5 microdiscectomy for herniated disc Right L4-5 transverse lumbar interbody fusion with 11 x 9 x 30 mm peek interbody spacer Right L4-5 nonsegmental instrumentation with Pathfinder pedicle screw system Right L4-5 posterolateral fusion Surgeon: Veterinary surgeon: Vertell Limber  After and placed the prone position the patient's back was prepped and draped in the usual sterile fashion. AP fluoroscopy was used to identify the area between the pedicles at L4 and L5 on the right. Linear incision was made in that region and carried down to the fascia which was incised. We then did sequential dilation through the muscle and placed our retractor over the dilators. We secured the table in standard fashion opened up and it lateral direction. We placed a lateral blade and opened up as well. We then spent 10-15 minutes to identify and bony anatomy identified the residual lamina of L4 and L5 and the residual facet joint. We then used a high-speed drill to enlarge the laminotomy and decompressed the thecal sac. We did a generous removal of the medial facet joint to allow access the disc space. We incised the disc space a 15 blade and thoroughly cleaned out pituitary rongeurs and curettes. Large amounts of herniated disc material identified and were removed these give excellent decompression the thecal sac. We then prepared the disc for interbody fusion. We distracted up to a 9 mm size and felt this was a good choice. We used a variety of instruments to clean the endplates and prepare them for interbody fusion. Prior to placing her cage we placed a mixture of autologous bone morselized allograft in the interspace to help with interbody fusion. We then impacted the cage without difficulty and kicked it into lateral direction across the showed to be in good position. We then placed pedicle screws on the right at L4-5. We did drill hole  entry point passed the ultrasonic guide pedicle all. Tapped with the 6 mm tap and then placed 6.5 mm x 40 mm screws at L4 and L5. These were followed into good position by fluoroscopy. We irrigated the wound copiously. Then decorticated the far lateral region placed a mixture of autologous bone morselized allograft for posterolateral fusion. We chose an appropriately length rod and secured at the top of the screws and did tightening and final tightening with torque and counter torque. Final fossae made lateral direction looked excellent. The was then irrigated once more and closed in multiple layers of Vicryl with Dermabond and Steri-Strips on the skin. Shortness was then applied and the patient was extubated and taken to recovery room in stable condition.

## 2013-11-25 NOTE — Transfer of Care (Signed)
Immediate Anesthesia Transfer of Care Note  Patient: Brianna [name]  Procedure(s) Performed: Procedure(s) with comments: TRANSFORAMINAL LUMBAR INTERBODY FUSION (TLIF) WITH PEDICLE SCREW FIXATION 1 LEVEL (Right) - TRANSFORAMINAL LUMBAR INTERBODY FUSION (TLIF) WITH PEDICLE SCREW FIXATION 1 LEVEL LUMBAR 4-5  Patient Location: PACU  Anesthesia Type:General  Level of Consciousness: awake, oriented and patient cooperative  Airway & Oxygen Therapy: Patient Spontanous Breathing and Patient connected to nasal cannula oxygen  Post-op Assessment: Report given to PACU RN and Post -op Vital signs reviewed and stable  Post vital signs: Reviewed  Complications: No apparent anesthesia complications

## 2013-11-25 NOTE — Anesthesia Procedure Notes (Signed)
Procedure Name: Intubation Date/Time: 11/25/2013 12:49 PM Performed by: Jenne Campus Pre-anesthesia Checklist: Patient identified, Emergency Drugs available, Suction available, Patient being monitored and Timeout performed Patient Re-evaluated:Patient Re-evaluated prior to inductionOxygen Delivery Method: Circle system utilized Preoxygenation: Pre-oxygenation with 100% oxygen Intubation Type: IV induction Ventilation: Mask ventilation without difficulty Laryngoscope Size: Miller and 2 Grade View: Grade II Tube type: Oral Tube size: 7.0 mm Number of attempts: 1 Airway Equipment and Method: Stylet Placement Confirmation: ETT inserted through vocal cords under direct vision,  positive ETCO2,  CO2 detector and breath sounds checked- equal and bilateral Secured at: 21 cm Tube secured with: Tape Dental Injury: Teeth and Oropharynx as per pre-operative assessment

## 2013-11-25 NOTE — H&P (Signed)
Brianna [name] is an 68 y.o. female.   Chief Complaint: Back and right leg pain HPI: The patient is a 68 year old female who had a discectomy at L4-5 on the right about a year ago. She initially did very well but then presented with recurrent pain. Check she had an MRI scan back in January which looked good with no evidence of recurrent disc herniation. When the pain persisted one-handed and other imaging study which showed a recurrent disc herniation L4-5. Discussed the options the patient requested surgery. We discussed the options of a redo discectomy versus an interbody fusion. The patient preferred the fusion now comes for a minimally invasive T. lift on the right at L4-5. I've had a long discussion with her regarding the risks and benefits of surgical intervention. The risks discussed include but are not limited to bleeding infection weakness numbness paralysis trouble with instrumentation nonunion coma and death. We've discussed the possibility of spinal fluid leakage and its treatment. She's had the opportunity to ask numerous questions and appears to understand. With this information in hand she has requested to proceed with surgery and she is admitted this time for her procedure.  Past Medical History  Diagnosis Date  . Attention deficit disorder with hyperactivity(314.01)   . Other chronic cystitis   . Posttraumatic stress disorder     followed by Dr Lovena Le, psychiatry  . Depressive disorder, not elsewhere classified   . Esophageal reflux   . Stricture and stenosis of esophagus   . Other and unspecified hyperlipidemia   . Unspecified hypothyroidism   . Insomnia   . Osteoarthritis   . Migraine   . Obstructive sleep apnea   . Temporomandibular joint disorders, unspecified   . Thoracic spondylosis without myelopathy   . Panic attacks   . Osteoporosis   . HLD (hyperlipidemia)   . Dysrhythmia   . Kidney stones   . Complication of anesthesia     shaking after sugery in the 1980's     Past Surgical History  Procedure Laterality Date  . Appendectomy    . Cholecystectomy    . Tubal ligation    . Bladder surgery    . Eye surgery    . Abdominal hysterectomy    . Tonsillectomy    . Colonoscopy    . Lumbar laminectomy/decompression microdiscectomy Right 01/28/2013    Procedure: LUMBAR LAMINECTOMY/DECOMPRESSION MICRODISCECTOMY LUMBAR FOUR-FIVE;  Surgeon: Faythe Ghee, MD;  Location: [facility];  Service: Neurosurgery;  Laterality: Right;  . Bladder tack      Family History  Problem Relation Age of Onset  . Heart attack Father   . Heart failure Father   . Liver disease Father   . Heart disease Father   . COPD Mother   . Depression Mother   . Diabetes Mother   . Crohn's disease Son    Social History:  reports that she has quit smoking. Her smoking use included Cigarettes. She has a 10 pack-year smoking history. She has never used smokeless tobacco. She reports that she does not drink alcohol or use illicit drugs.  Allergies:  Allergies  Allergen Reactions  . Tetracycline Swelling  . Lactose Intolerance (Gi) Nausea And Vomiting    Medications Prior to Admission  Medication Sig Dispense Refill  . aspirin 81 MG tablet Take 81 mg by mouth daily.        Marland Kitchen HYDROcodone-acetaminophen (NORCO/VICODIN) 5-325 MG per tablet Take 1 tablet by mouth every 6 (six) hours as needed (pain).       Marland Kitchen  hydrOXYzine (ATARAX/VISTARIL) 10 MG tablet Take 10 mg by mouth every 4 (four) hours as needed for itching or anxiety.       Marland Kitchen levothyroxine (SYNTHROID, LEVOTHROID) 100 MCG tablet Take 50 mcg by mouth daily.       . memantine (NAMENDA) 10 MG tablet Take 10 mg by mouth daily. Take 1/2 a day for a week, then 1 a day      . Omeprazole (RA OMEPRAZOLE) 20 MG TBEC Take 20 mg by mouth daily. Frequency:   Dosage:0   MG  Instructions:  Note:TAKE 1 A DAY AS NEEDED      . pravastatin (PRAVACHOL) 20 MG tablet Take 20 mg by mouth daily.       . Travoprost, BAK Free, (TRAVATAN) 0.004 % SOLN  ophthalmic solution Place 1 drop into both eyes at bedtime.      . Vitamin D, Ergocalciferol, (DRISDOL) 50000 UNITS CAPS capsule Take 50,000 Units by mouth every 7 (seven) days. saturdays        No results found for this or any previous visit (from the past 48 hour(s)). No results found.  Positive for nasal congestion source of her cholesterol leg pain shortness of breath digestive nausea history gastritis abdominal pain,  Blood pressure 125/52, pulse 80, temperature 97.8 F (36.6 C), temperature source Oral, resp. rate 16, weight 69.4 kg (153 lb), SpO2 100.00%.  Patient is awake alert and oriented. She is no facial asymmetry. He is mildly antalgic. She has decreased right ankle jerk reflex normal strength and sensation Assessment/Plan Impression a recurrent disc herniation at L4-5 on the right. The plan is for a right L4-5 minimally invasive T. lift.  Faythe Ghee, MD 11/25/2013, 12:20 PM

## 2013-11-25 NOTE — Plan of Care (Signed)
Problem: Consults Goal: Diagnosis - Spinal Surgery Outcome: Completed/Met Date Met:  11/25/13 Thoraco/Lumbar Spine Fusion

## 2013-11-26 MED ORDER — OXYCODONE-ACETAMINOPHEN 5-325 MG PO TABS: 1.0000 | ORAL_TABLET | ORAL | Status: DC | PRN

## 2013-11-26 NOTE — Discharge Summary (Signed)
  Physician Discharge Summary  Patient ID: Brianna [name] MRN: [id number] DOB/AGE: June 14, 1945 68 y.o.  Admit date: 11/25/2013 Discharge date: 11/26/2013  Admission Diagnoses:  Discharge Diagnoses:  Active Problems:   Lumbar disc herniation   Discharged Condition: good  Hospital Course: Surgery yesterday for minimally invasive fusion L 45; did well; marked improvement in pain. By pod 1, ambulating well. Wound looks good. Home with specific instructions given.  Consults: None  Significant Diagnostic Studies:  none  Treatments: surgery: right L 45 minimally invasive TLIF  Discharge Exam: Blood pressure 99/55, pulse 85, temperature 97.8 F (36.6 C), temperature source Oral, resp. rate 18, weight 69.4 kg (153 lb), SpO2 94.00%. Incision/Wound:clean and dry; no new neuro issues  Disposition: 01-Home or Self Care     Medication List    ASK your doctor about these medications       aspirin 81 MG tablet  Take 81 mg by mouth daily.     HYDROcodone-acetaminophen 5-325 MG per tablet  Commonly known as:  NORCO/VICODIN  Take 1 tablet by mouth every 6 (six) hours as needed (pain).     hydrOXYzine 10 MG tablet  Commonly known as:  ATARAX/VISTARIL  Take 10 mg by mouth every 4 (four) hours as needed for itching or anxiety.     levothyroxine 100 MCG tablet  Commonly known as:  SYNTHROID, LEVOTHROID  Take 50 mcg by mouth daily.     memantine 10 MG tablet  Commonly known as:  NAMENDA  Take 10 mg by mouth daily. Take 1/2 a day for a week, then 1 a day     pravastatin 20 MG tablet  Commonly known as:  PRAVACHOL  Take 20 mg by mouth daily.     RA OMEPRAZOLE 20 MG Tbec  Generic drug:  Omeprazole  Take 20 mg by mouth daily. Frequency:   Dosage:0   MG  Instructions:  Note:TAKE 1 A DAY AS NEEDED     Travoprost (BAK Free) 0.004 % Soln ophthalmic solution  Commonly known as:  TRAVATAN  Place 1 drop into both eyes at bedtime.     Vitamin D (Ergocalciferol) 50000 UNITS Caps capsule   Commonly known as:  DRISDOL  Take 50,000 Units by mouth every 7 (seven) days. saturdays         At home rest most of the time. Get up 9 or 10 times each day and take a 15 or 20 minute walk. No riding in the car and to your first postoperative appointment. If you have neck surgery you may shower from the chest down starting on the third postoperative day. If you had back surgery he may start showering on the third postoperative day with saran wrap wrapped around your incisional area 3 times. After the shower remove the saran wrap. Take pain medicine as needed and other medications as instructed. Call my office for an appointment.  SignedFaythe Ghee, MD 11/26/2013, 8:55 AM

## 2013-11-26 NOTE — Progress Notes (Signed)
Pt doing well. Pt given D/C instructions with Rx, verbal understanding was given. Pt's IV was removed prior to D/C. Pt's incision is clean and dry, dressing was removed by Dr. Hal Neer prior to D/C. Pt D/C'd home via wheelchair @ 1010 per MD order. Pt is stable @ D/C and has no other needs. Holli Humbles, RN

## 2013-11-26 NOTE — Discharge Instructions (Signed)
Wound Care Keep incision covered and dry for one week. You may shower from the neck down. You may remove outer bandage after one week.  Do not put any creams, lotions, or ointments on incision. Leave steri-strips on neck.  They will fall off by themselves. Activity Walk each and every day, increasing distance each day. No lifting greater than 5 lbs.  Avoid bending, arching, and twisting. No driving or riding in car until further notice at follow up appointment. If provided with back brace, wear when out of bed.  It is not necessary to wear brace in bed. Diet Resume your normal diet.  Return to Work Will be discussed at you follow up appointment. Call Your Doctor If Any of These Occur Redness, drainage, or swelling at the wound.  Temperature greater than 101 degrees. Severe pain not relieved by pain medication. Incision starts to come apart. Follow Up Appt Call today for appointment in 1-2 weeks [phone]) or for problems.  If you have any hardware placed in your spine, you will need an x-ray before your appointment.    Spinal Fusion Care After Refer to this sheet in the next few weeks. These instructions provide you with information on caring for yourself after your procedure. Your caregiver may also give you more specific instructions. Your treatment has been planned according to current medical practices, but problems sometimes occur. Call your caregiver if you have any problems or questions after your procedure. HOME CARE INSTRUCTIONS   Take whatever pain medicine has been prescribed by your caregiver. Do not take over-the-counter pain medicine unless directed otherwise by your caregiver.  Do not drive if you are taking narcotic pain medicines.  Change your bandage (dressing) if necessary or as directed by your caregiver.  Do not get your surgical cut (incision) wet. After a few days you may take quick showers (rather than baths), but keep your incision clean and dry. Covering  the incision with plastic wrap while you shower should keep your incision dry. A few weeks after surgery, once your incision has healed and your caregiver says it is okay, you can take baths or go swimming.  If you have been prescribed medicine to prevent your blood from clotting, follow the directions carefully.  Check the area around your incision often. Look for redness and swelling. Also, look for anything leaking from your wound. You can use a mirror or have a family member inspect your incision if it is in a place where it is difficult for you to see.  Ask your caregiver what activities you should avoid and for how long.  Walk as much as possible.  Do not lift anything heavier than 10 pounds (4.5 kilograms) until your caregiver says it is safe.  Do not twist or bend for a few weeks. Try not to pull on things. Avoid sitting for long periods of time. Change positions at least every hour.  Ask your caregiver what kinds of exercise you should do to make your back stronger and when you should begin doing these exercises. SEEK IMMEDIATE MEDICAL CARE IF:   Pain suddenly becomes much worse.  The incision area is red, swollen, bleeding, or leaking fluid.  Your legs or feet become increasingly painful, numb, weak, or swollen.  You have trouble controlling urination or bowel movements.  You have trouble breathing.  You have chest pain.  You have a fever. MAKE SURE YOU:  Understand these instructions.  Will watch your condition.  Will get help right away if  you are not doing well or get worse. Document Released: 10/12/2004 Document Revised: 06/17/2011 Document Reviewed: 06/07/2010 [facility] Patient Information 2015 [place], [place]. This information is not intended to replace advice given to you by your health care provider. Make sure you discuss any questions you have with your health care provider.

## 2013-11-26 NOTE — Progress Notes (Signed)
Utilization review completed.  

## 2013-11-29 ENCOUNTER — Encounter (HOSPITAL_COMMUNITY): Payer: Self-pay | Admitting: Neurosurgery

## 2013-12-12 ENCOUNTER — Emergency Department (HOSPITAL_BASED_OUTPATIENT_CLINIC_OR_DEPARTMENT_OTHER)
Admission: EM | Admit: 2013-12-12 | Discharge: 2013-12-12 | Disposition: A | Discharge status: Home / Self Care | Payer: Medicare Other | Attending: Emergency Medicine | Admitting: Emergency Medicine

## 2013-12-12 ENCOUNTER — Encounter (HOSPITAL_BASED_OUTPATIENT_CLINIC_OR_DEPARTMENT_OTHER): Payer: Self-pay | Admitting: Emergency Medicine

## 2013-12-12 DIAGNOSIS — I10 Essential (primary) hypertension: Secondary | ICD-10-CM | POA: Insufficient documentation

## 2013-12-12 DIAGNOSIS — IMO0002 Reserved for concepts with insufficient information to code with codable children: Secondary | ICD-10-CM | POA: Insufficient documentation

## 2013-12-12 DIAGNOSIS — Z87442 Personal history of urinary calculi: Secondary | ICD-10-CM | POA: Insufficient documentation

## 2013-12-12 DIAGNOSIS — Z7982 Long term (current) use of aspirin: Secondary | ICD-10-CM | POA: Insufficient documentation

## 2013-12-12 DIAGNOSIS — E785 Hyperlipidemia, unspecified: Secondary | ICD-10-CM | POA: Insufficient documentation

## 2013-12-12 DIAGNOSIS — Z87891 Personal history of nicotine dependence: Secondary | ICD-10-CM | POA: Insufficient documentation

## 2013-12-12 DIAGNOSIS — M1990 Unspecified osteoarthritis, unspecified site: Secondary | ICD-10-CM | POA: Insufficient documentation

## 2013-12-12 DIAGNOSIS — G43909 Migraine, unspecified, not intractable, without status migrainosus: Secondary | ICD-10-CM | POA: Diagnosis not present

## 2013-12-12 DIAGNOSIS — Z79899 Other long term (current) drug therapy: Secondary | ICD-10-CM | POA: Insufficient documentation

## 2013-12-12 DIAGNOSIS — R21 Rash and other nonspecific skin eruption: Secondary | ICD-10-CM | POA: Diagnosis present

## 2013-12-12 DIAGNOSIS — M47814 Spondylosis without myelopathy or radiculopathy, thoracic region: Secondary | ICD-10-CM | POA: Diagnosis not present

## 2013-12-12 DIAGNOSIS — L509 Urticaria, unspecified: Secondary | ICD-10-CM | POA: Diagnosis not present

## 2013-12-12 DIAGNOSIS — T7840XA Allergy, unspecified, initial encounter: Secondary | ICD-10-CM

## 2013-12-12 DIAGNOSIS — E039 Hypothyroidism, unspecified: Secondary | ICD-10-CM | POA: Insufficient documentation

## 2013-12-12 DIAGNOSIS — Z8659 Personal history of other mental and behavioral disorders: Secondary | ICD-10-CM | POA: Diagnosis not present

## 2013-12-12 DIAGNOSIS — K219 Gastro-esophageal reflux disease without esophagitis: Secondary | ICD-10-CM | POA: Diagnosis not present

## 2013-12-12 DIAGNOSIS — Z87448 Personal history of other diseases of urinary system: Secondary | ICD-10-CM | POA: Insufficient documentation

## 2013-12-12 LAB — URINALYSIS, ROUTINE W REFLEX MICROSCOPIC
Bilirubin Urine: NEGATIVE
Glucose, UA: NEGATIVE mg/dL
Hgb urine dipstick: NEGATIVE
Ketones, ur: NEGATIVE mg/dL
Nitrite: NEGATIVE
Protein, ur: NEGATIVE mg/dL
Specific Gravity, Urine: 1.019 (ref 1.005–1.030)
Urobilinogen, UA: 0.2 mg/dL (ref 0.0–1.0)
pH: 5.5 (ref 5.0–8.0)

## 2013-12-12 LAB — URINE MICROSCOPIC-ADD ON

## 2013-12-12 MED ORDER — OXYCODONE-ACETAMINOPHEN 5-325 MG PO TABS
1.0000 | ORAL_TABLET | Freq: Once | ORAL | Status: AC
  Administered 2013-12-12: 2 via ORAL
  Filled 2013-12-12: qty 2

## 2013-12-12 MED ORDER — FAMOTIDINE 20 MG PO TABS
20.0000 mg | ORAL_TABLET | Freq: Once | ORAL | Status: AC
  Administered 2013-12-12: 20 mg via ORAL
  Filled 2013-12-12: qty 1

## 2013-12-12 MED ORDER — DIPHENHYDRAMINE HCL 25 MG PO TABS: 25.0000 mg | ORAL_TABLET | Freq: Four times a day (QID) | ORAL | Status: DC | PRN

## 2013-12-12 MED ORDER — PREDNISONE 50 MG PO TABS
60.0000 mg | ORAL_TABLET | Freq: Once | ORAL | Status: AC
  Administered 2013-12-12: 60 mg via ORAL
  Filled 2013-12-12 (×2): qty 1

## 2013-12-12 MED ORDER — DIPHENHYDRAMINE HCL 25 MG PO CAPS
25.0000 mg | ORAL_CAPSULE | Freq: Once | ORAL | Status: AC
  Administered 2013-12-12: 25 mg via ORAL
  Filled 2013-12-12: qty 1

## 2013-12-12 MED ORDER — PREDNISONE 10 MG PO TABS: 20.0000 mg | ORAL_TABLET | Freq: Every day | ORAL | Status: DC

## 2013-12-12 MED ORDER — FAMOTIDINE 20 MG PO TABS: 20.0000 mg | ORAL_TABLET | Freq: Two times a day (BID) | ORAL | Status: DC

## 2013-12-12 NOTE — ED Provider Notes (Signed)
CSN: [id number]     Arrival date & time 12/12/13  8127 History   First MD Initiated Contact with Patient 12/12/13 [phone]     Chief Complaint  Patient presents with  . Allergic Reaction     (Consider location/radiation/quality/duration/timing/severity/associated sxs/prior Treatment) HPI Comments: Patient reports onset of urticaria and pruritus around noon yesterday. She states rash is located over her face, lower abdomen, and buttock. She states she took 2 Benadryl yesterday afternoon, and 2 Benadryl last night before bed. She states symptoms are somewhat improved but still present this morning. She denies shortness of breath intraoral swelling, nausea, vomiting, diarrhea. She is unsure of any new exposures but states she has been walking outside recently.  Patient is a 68 y.o. female presenting with allergic reaction. The history is provided by the patient. No language interpreter was used.  Allergic Reaction Presenting symptoms: itching and rash   Severity:  Moderate Prior allergic episodes:  Unable to specify Context comment:  Unsure Relieved by:  Antihistamines Worsened by:  Nothing tried Ineffective treatments:  None tried   Past Medical History  Diagnosis Date  . Attention deficit disorder with hyperactivity(314.01)   . Other chronic cystitis   . Posttraumatic stress disorder     followed by Dr Lovena Le, psychiatry  . Depressive disorder, not elsewhere classified   . Esophageal reflux   . Stricture and stenosis of esophagus   . Other and unspecified hyperlipidemia   . Unspecified hypothyroidism   . Insomnia   . Osteoarthritis   . Migraine   . Obstructive sleep apnea   . Temporomandibular joint disorders, unspecified   . Thoracic spondylosis without myelopathy   . Panic attacks   . Osteoporosis   . HLD (hyperlipidemia)   . Dysrhythmia   . Kidney stones   . Complication of anesthesia     shaking after sugery in the 1980's   Past Surgical History  Procedure Laterality  Date  . Appendectomy    . Cholecystectomy    . Tubal ligation    . Bladder surgery    . Eye surgery    . Abdominal hysterectomy    . Tonsillectomy    . Colonoscopy    . Lumbar laminectomy/decompression microdiscectomy Right 01/28/2013    Procedure: LUMBAR LAMINECTOMY/DECOMPRESSION MICRODISCECTOMY LUMBAR FOUR-FIVE;  Surgeon: Faythe Ghee, MD;  Location: [facility];  Service: Neurosurgery;  Laterality: Right;  . Bladder tack    . Transforaminal lumbar interbody fusion (tlif) with pedicle screw fixation 1 level Right 11/25/2013    Procedure: TRANSFORAMINAL LUMBAR INTERBODY FUSION (TLIF) WITH PEDICLE SCREW FIXATION 1 LEVEL;  Surgeon: Faythe Ghee, MD;  Location: [facility];  Service: Neurosurgery;  Laterality: Right;  TRANSFORAMINAL LUMBAR INTERBODY FUSION (TLIF) WITH PEDICLE SCREW FIXATION 1 LEVEL LUMBAR 4-5   Family History  Problem Relation Age of Onset  . Heart attack Father   . Heart failure Father   . Liver disease Father   . Heart disease Father   . COPD Mother   . Depression Mother   . Diabetes Mother   . Crohn's disease Son    History  Substance Use Topics  . Smoking status: Former Smoker -- 0.50 packs/day for 20 years    Types: Cigarettes  . Smokeless tobacco: Never Used  . Alcohol Use: No   OB History   Grav Para Term Preterm Abortions TAB SAB Ect Mult Living                 Review of Systems  Constitutional: Negative for fever, chills, diaphoresis, activity change, appetite change and fatigue.  HENT: Negative for congestion, facial swelling, rhinorrhea and sore throat.   Eyes: Negative for photophobia and discharge.  Respiratory: Negative for cough, chest tightness and shortness of breath.   Cardiovascular: Negative for chest pain, palpitations and leg swelling.  Gastrointestinal: Negative for nausea, vomiting, abdominal pain and diarrhea.  Endocrine: Negative for polydipsia and polyuria.  Genitourinary: Negative for dysuria, frequency, difficulty  urinating and pelvic pain.  Musculoskeletal: Negative for arthralgias, back pain, neck pain and neck stiffness.  Skin: Positive for itching and rash. Negative for color change and wound.  Allergic/Immunologic: Negative for immunocompromised state.  Neurological: Negative for facial asymmetry, weakness, numbness and headaches.  Hematological: Does not bruise/bleed easily.  Psychiatric/Behavioral: Negative for confusion and agitation.      Allergies  Tetracycline and Lactose intolerance (gi)  Home Medications   Prior to Admission medications   Medication Sig Start Date End Date Taking? Authorizing Provider  aspirin 81 MG tablet Take 81 mg by mouth daily.   Yes Historical Provider, MD  diphenhydrAMINE (BENADRYL) 25 MG tablet Take 1 tablet (25 mg total) by mouth every 6 (six) hours as needed. For 3 days 12/12/13   Ernestina Patches, MD  famotidine (PEPCID) 20 MG tablet Take 1 tablet (20 mg total) by mouth 2 (two) times daily. For 3 days 12/12/13   Ernestina Patches, MD  hydrOXYzine (ATARAX/VISTARIL) 10 MG tablet Take 10 mg by mouth every 4 (four) hours as needed for itching or anxiety.  02/27/11   Historical Provider, MD  levothyroxine (SYNTHROID, LEVOTHROID) 100 MCG tablet Take 50 mcg by mouth daily.     Historical Provider, MD  memantine (NAMENDA) 10 MG tablet Take 10 mg by mouth daily. Take 1/2 a day for a week, then 1 a day 08/26/13   Historical Provider, MD  Omeprazole (RA OMEPRAZOLE) 20 MG TBEC Take 20 mg by mouth daily. Frequency:   Dosage:0   MG  Instructions:  Note:TAKE 1 A DAY AS NEEDED 04/12/10   Historical Provider, MD  oxyCODONE-acetaminophen (PERCOCET/ROXICET) 5-325 MG per tablet Take 1-2 tablets by mouth every 4 (four) hours as needed for moderate pain. 11/26/13   Faythe Ghee, MD  pravastatin (PRAVACHOL) 20 MG tablet Take 20 mg by mouth daily.  08/31/13 08/31/14  Historical Provider, MD  predniSONE (DELTASONE) 10 MG tablet Take 2 tablets (20 mg total) by mouth daily. For 3 days 12/12/13    Ernestina Patches, MD  Travoprost, BAK Free, (TRAVATAN) 0.004 % SOLN ophthalmic solution Place 1 drop into both eyes at bedtime.    Historical Provider, MD  Vitamin D, Ergocalciferol, (DRISDOL) 50000 UNITS CAPS capsule Take 50,000 Units by mouth every 7 (seven) days. saturdays    Historical Provider, MD   BP 108/62  Pulse 108  Temp(Src) 98.3 F (36.8 C) (Oral)  Resp 16  Ht 5' 1.5" (1.562 m)  Wt 150 lb (68.04 kg)  BMI 27.89 kg/m2  SpO2 99% Physical Exam  Constitutional: She is oriented to person, place, and time. She appears well-developed and well-nourished. No distress.  HENT:  Head: Normocephalic and atraumatic.  Mouth/Throat: No oropharyngeal exudate.  Eyes: Pupils are equal, round, and reactive to light.  Neck: Normal range of motion. Neck supple.  Cardiovascular: Normal rate, regular rhythm and normal heart sounds.  Exam reveals no gallop and no friction rub.   No murmur heard. Pulmonary/Chest: Effort normal and breath sounds normal. No respiratory distress. She has no wheezes. She has no  rales.  Abdominal: Soft. Bowel sounds are normal. She exhibits no distension and no mass. There is no tenderness. There is no rebound and no guarding.  Musculoskeletal: Normal range of motion. She exhibits no edema and no tenderness.  Neurological: She is alert and oriented to person, place, and time.  Skin: Skin is warm and dry. Rash noted. Rash is urticarial.     Urticarial rash to face, lower abdomen, buttock  Psychiatric: She has a normal mood and affect.    ED Course  Procedures (including critical care time) Labs Review Labs Reviewed  URINALYSIS, ROUTINE W REFLEX MICROSCOPIC - Abnormal; Notable for the following:    Color, Urine AMBER (*)    APPearance CLOUDY (*)    Leukocytes, UA SMALL (*)    All other components within normal limits  URINE MICROSCOPIC-ADD ON - Abnormal; Notable for the following:    Squamous Epithelial / LPF MANY (*)    Bacteria, UA MANY (*)    All other  components within normal limits  URINE CULTURE    Imaging Review No results found.   EKG Interpretation None      MDM   Final diagnoses:  Allergic reaction, initial encounter    Pt is a 68 y.o. female with Pmhx as above who presents with an improving urticarial rash to face lower abdomen and back since around noon yesterday. She states she took Benadryl with some improvement in symptoms, she noticed her eyelids were swollen this morning and came in for evaluation. She denies respiratory symptoms, intraoral swelling, nausea vomiting, diarrhea. She is unsure of any new contacts. On physical exam she is well appearing and in no acute distress. Cardiopulmonary exam is benign. Rash as above. She reports she has had some dysuria yesterday so will check a UA. By mouth Benadryl, Pepcid, and prednisone have been given in department. UAdoes not appear infected, but culture sent. We'll DC home at 3 days of scheduled Benadryl, Pepcid, prednisone. Return precautions given for new or worsening symptoms including worsening rash, swelling, trouble breathing, fever.        Ernestina Patches, MD 12/12/13 1018

## 2013-12-12 NOTE — Discharge Instructions (Signed)

## 2013-12-12 NOTE — ED Notes (Signed)
Patient thinks she may have been bitten by an insect. Having swelling, itching at lower abd and hips, this morning woke up and her left eye is swollen.

## 2013-12-14 LAB — URINE CULTURE: Colony Count: >=100000

## 2013-12-15 ENCOUNTER — Telehealth (HOSPITAL_BASED_OUTPATIENT_CLINIC_OR_DEPARTMENT_OTHER): Payer: Self-pay | Admitting: Emergency Medicine

## 2013-12-15 NOTE — Telephone Encounter (Signed)
Post ED Visit - Positive Culture Follow-up: Successful Patient Follow-Up  Culture assessed and recommendations reviewed by: []  Wes Berthoud, Pharm.D., BCPS []  Heide Guile, Pharm.D., BCPS []  Alycia Rossetti, Pharm.D., BCPS []  [place], Pharm.D., BCPS, AAHIVP []  Legrand Como, Pharm.D., BCPS, AAHIVP []  Hassie Bruce, Pharm.D. [x]  Milus Glazier, Pharm.D.  Positive urine culture >100,000 colonies/ml E. Coli  [x]  Patient discharged without antimicrobial prescription and treatment is now indicated []  Organism is resistant to prescribed ED discharge antimicrobial []  Patient with positive blood cultures  Changes discussed with ED provider: Montine Circle PA New antibiotic prescription Keflex 500mg  po bid x 7days Called to   [facility] patient, date   time    Hazle Nordmann 12/15/2013, 3:30 PM

## 2013-12-15 NOTE — Progress Notes (Signed)
ED Antimicrobial Stewardship Positive Culture Follow Up   Brianna [name] is an 68 y.o. female who presented to [facility] on 12/12/2013 with a chief complaint of  Chief Complaint  Patient presents with  . Allergic Reaction    Recent Results (from the past 720 hour(s))  SURGICAL PCR SCREEN     Status: None   Collection Time    11/17/13 10:47 AM      Result Value Ref Range Status   MRSA, PCR NEGATIVE  NEGATIVE Final   Staphylococcus aureus NEGATIVE  NEGATIVE Final   Comment:            The Xpert SA Assay (FDA     approved for NASAL specimens     in patients over 64 years of age),     is one component of     a comprehensive surveillance     program.  Test performance has     been validated by [employer] for patients greater     than or equal to 70 year old.     It is not intended     to diagnose infection nor to     guide or monitor treatment.  URINE CULTURE     Status: None   Collection Time    12/12/13  9:22 AM      Result Value Ref Range Status   Specimen Description URINE, CLEAN CATCH   Final   Special Requests NONE   Final   Culture  Setup Time     Final   Value: 12/12/2013 23:05     Performed at [place]     Final   Value: >=100,000 COLONIES/ML     Performed at [facility]   Culture     Final   Value: ESCHERICHIA COLI     Performed at [facility]   Report Status 12/14/2013 FINAL   Final   Organism ID, Bacteria ESCHERICHIA COLI   Final     [x]  Patient discharged originally without antimicrobial agent and treatment is now indicated  New antibiotic prescription: cephalexin 500mg  PO BID x 7 days  ED Provider: Lorre Munroe PA-C   [name], [place] M 12/15/2013, 10:22 AM Infectious Diseases Pharmacist Phone# [phone]

## 2013-12-17 DIAGNOSIS — J309 Allergic rhinitis, unspecified: Secondary | ICD-10-CM | POA: Insufficient documentation

## 2013-12-17 DIAGNOSIS — M8580 Other specified disorders of bone density and structure, unspecified site: Secondary | ICD-10-CM | POA: Insufficient documentation

## 2013-12-17 DIAGNOSIS — S3421XA Injury of nerve root of lumbar spine, initial encounter: Secondary | ICD-10-CM | POA: Insufficient documentation

## 2013-12-17 DIAGNOSIS — G4700 Insomnia, unspecified: Secondary | ICD-10-CM | POA: Insufficient documentation

## 2013-12-17 DIAGNOSIS — N3020 Other chronic cystitis without hematuria: Secondary | ICD-10-CM | POA: Insufficient documentation

## 2013-12-19 ENCOUNTER — Telehealth (HOSPITAL_BASED_OUTPATIENT_CLINIC_OR_DEPARTMENT_OTHER): Payer: Self-pay | Admitting: Emergency Medicine

## 2013-12-19 NOTE — Telephone Encounter (Signed)
Post ED Visit - Positive Culture Follow-up: Successful Patient Follow-Up  Culture assessed and recommendations reviewed by: []  Wes Rhea, Pharm.D., BCPS []  Heide Guile, Pharm.D., BCPS []  Alycia Rossetti, Pharm.D., BCPS []  [place], Pharm.D., BCPS, AAHIVP []  Legrand Como, Pharm.D., BCPS, AAHIVP []  Hassie Bruce, Pharm.D. [x]  Milus Glazier, [place].D.  Positive urine culture  [x]  Patient discharged without antimicrobial prescription and treatment is now indicated []  Organism is resistant to prescribed ED discharge antimicrobial []  Patient with positive blood cultures  Changes discussed with ED provider: Loa Socks PA New antibiotic prescription Keflex 500 mg PO BID for seven days Called to [facility] [phone]  Contacted patient, date 12/18/13, time 1325   Brianna [name] 12/19/2013, 5:04 PM

## 2014-01-28 ENCOUNTER — Other Ambulatory Visit (HOSPITAL_BASED_OUTPATIENT_CLINIC_OR_DEPARTMENT_OTHER): Payer: Self-pay | Admitting: Neurosurgery

## 2014-01-28 ENCOUNTER — Ambulatory Visit (HOSPITAL_BASED_OUTPATIENT_CLINIC_OR_DEPARTMENT_OTHER)
Admission: RE | Admit: 2014-01-28 | Discharge: 2014-01-28 | Disposition: A | Discharge status: Home / Self Care | Payer: Medicare Other | Source: Ambulatory Visit | Attending: Neurosurgery | Admitting: Neurosurgery

## 2014-01-28 DIAGNOSIS — M5126 Other intervertebral disc displacement, lumbar region: Secondary | ICD-10-CM

## 2014-01-28 DIAGNOSIS — M545 Low back pain: Secondary | ICD-10-CM | POA: Diagnosis not present

## 2015-04-12 DIAGNOSIS — F330 Major depressive disorder, recurrent, mild: Secondary | ICD-10-CM | POA: Diagnosis not present

## 2015-04-14 DIAGNOSIS — N3941 Urge incontinence: Secondary | ICD-10-CM | POA: Diagnosis not present

## 2015-04-24 DIAGNOSIS — F330 Major depressive disorder, recurrent, mild: Secondary | ICD-10-CM | POA: Diagnosis not present

## 2015-04-24 DIAGNOSIS — Z23 Encounter for immunization: Secondary | ICD-10-CM | POA: Diagnosis not present

## 2015-04-24 DIAGNOSIS — G4733 Obstructive sleep apnea (adult) (pediatric): Secondary | ICD-10-CM | POA: Diagnosis not present

## 2015-04-24 DIAGNOSIS — R45851 Suicidal ideations: Secondary | ICD-10-CM | POA: Diagnosis not present

## 2015-04-24 DIAGNOSIS — E038 Other specified hypothyroidism: Secondary | ICD-10-CM | POA: Diagnosis not present

## 2015-04-24 DIAGNOSIS — Z0000 Encounter for general adult medical examination without abnormal findings: Secondary | ICD-10-CM | POA: Diagnosis not present

## 2015-04-24 DIAGNOSIS — F419 Anxiety disorder, unspecified: Secondary | ICD-10-CM | POA: Diagnosis not present

## 2015-04-24 DIAGNOSIS — E784 Other hyperlipidemia: Secondary | ICD-10-CM | POA: Diagnosis not present

## 2015-04-24 DIAGNOSIS — E785 Hyperlipidemia, unspecified: Secondary | ICD-10-CM | POA: Diagnosis not present

## 2015-04-24 DIAGNOSIS — G4709 Other insomnia: Secondary | ICD-10-CM | POA: Diagnosis not present

## 2015-04-24 DIAGNOSIS — E119 Type 2 diabetes mellitus without complications: Secondary | ICD-10-CM | POA: Diagnosis not present

## 2015-04-26 DIAGNOSIS — E119 Type 2 diabetes mellitus without complications: Secondary | ICD-10-CM | POA: Diagnosis not present

## 2015-05-15 DIAGNOSIS — F331 Major depressive disorder, recurrent, moderate: Secondary | ICD-10-CM | POA: Diagnosis not present

## 2015-05-24 DIAGNOSIS — Z83511 Family history of glaucoma: Secondary | ICD-10-CM | POA: Diagnosis not present

## 2015-05-24 DIAGNOSIS — H524 Presbyopia: Secondary | ICD-10-CM | POA: Diagnosis not present

## 2015-05-24 DIAGNOSIS — H2513 Age-related nuclear cataract, bilateral: Secondary | ICD-10-CM | POA: Diagnosis not present

## 2015-05-24 DIAGNOSIS — H268 Other specified cataract: Secondary | ICD-10-CM | POA: Diagnosis not present

## 2015-05-24 DIAGNOSIS — H401131 Primary open-angle glaucoma, bilateral, mild stage: Secondary | ICD-10-CM | POA: Diagnosis not present

## 2015-05-24 DIAGNOSIS — H04123 Dry eye syndrome of bilateral lacrimal glands: Secondary | ICD-10-CM | POA: Diagnosis not present

## 2015-06-19 ENCOUNTER — Emergency Department (HOSPITAL_BASED_OUTPATIENT_CLINIC_OR_DEPARTMENT_OTHER)
Admission: EM | Admit: 2015-06-19 | Discharge: 2015-06-19 | Disposition: A | Discharge status: Home / Self Care | Payer: PPO | Attending: Emergency Medicine | Admitting: Emergency Medicine

## 2015-06-19 ENCOUNTER — Emergency Department (HOSPITAL_BASED_OUTPATIENT_CLINIC_OR_DEPARTMENT_OTHER): Payer: PPO

## 2015-06-19 ENCOUNTER — Encounter (HOSPITAL_BASED_OUTPATIENT_CLINIC_OR_DEPARTMENT_OTHER): Payer: Self-pay | Admitting: *Deleted

## 2015-06-19 DIAGNOSIS — Z9851 Tubal ligation status: Secondary | ICD-10-CM | POA: Insufficient documentation

## 2015-06-19 DIAGNOSIS — R1013 Epigastric pain: Secondary | ICD-10-CM

## 2015-06-19 DIAGNOSIS — E785 Hyperlipidemia, unspecified: Secondary | ICD-10-CM | POA: Diagnosis not present

## 2015-06-19 DIAGNOSIS — Z8679 Personal history of other diseases of the circulatory system: Secondary | ICD-10-CM | POA: Diagnosis not present

## 2015-06-19 DIAGNOSIS — Z87448 Personal history of other diseases of urinary system: Secondary | ICD-10-CM | POA: Diagnosis not present

## 2015-06-19 DIAGNOSIS — E039 Hypothyroidism, unspecified: Secondary | ICD-10-CM | POA: Diagnosis not present

## 2015-06-19 DIAGNOSIS — R072 Precordial pain: Secondary | ICD-10-CM | POA: Diagnosis not present

## 2015-06-19 DIAGNOSIS — Z7982 Long term (current) use of aspirin: Secondary | ICD-10-CM | POA: Diagnosis not present

## 2015-06-19 DIAGNOSIS — M1990 Unspecified osteoarthritis, unspecified site: Secondary | ICD-10-CM | POA: Diagnosis not present

## 2015-06-19 DIAGNOSIS — Z79899 Other long term (current) drug therapy: Secondary | ICD-10-CM | POA: Diagnosis not present

## 2015-06-19 DIAGNOSIS — Z87891 Personal history of nicotine dependence: Secondary | ICD-10-CM | POA: Diagnosis not present

## 2015-06-19 DIAGNOSIS — G43909 Migraine, unspecified, not intractable, without status migrainosus: Secondary | ICD-10-CM | POA: Diagnosis not present

## 2015-06-19 DIAGNOSIS — Z7952 Long term (current) use of systemic steroids: Secondary | ICD-10-CM | POA: Insufficient documentation

## 2015-06-19 DIAGNOSIS — F410 Panic disorder [episodic paroxysmal anxiety] without agoraphobia: Secondary | ICD-10-CM | POA: Diagnosis not present

## 2015-06-19 DIAGNOSIS — M810 Age-related osteoporosis without current pathological fracture: Secondary | ICD-10-CM | POA: Insufficient documentation

## 2015-06-19 DIAGNOSIS — R10812 Left upper quadrant abdominal tenderness: Secondary | ICD-10-CM | POA: Diagnosis not present

## 2015-06-19 DIAGNOSIS — F329 Major depressive disorder, single episode, unspecified: Secondary | ICD-10-CM | POA: Insufficient documentation

## 2015-06-19 DIAGNOSIS — R079 Chest pain, unspecified: Secondary | ICD-10-CM | POA: Diagnosis not present

## 2015-06-19 DIAGNOSIS — Z87442 Personal history of urinary calculi: Secondary | ICD-10-CM | POA: Insufficient documentation

## 2015-06-19 DIAGNOSIS — G4700 Insomnia, unspecified: Secondary | ICD-10-CM | POA: Diagnosis not present

## 2015-06-19 DIAGNOSIS — F909 Attention-deficit hyperactivity disorder, unspecified type: Secondary | ICD-10-CM | POA: Diagnosis not present

## 2015-06-19 DIAGNOSIS — K219 Gastro-esophageal reflux disease without esophagitis: Secondary | ICD-10-CM | POA: Insufficient documentation

## 2015-06-19 DIAGNOSIS — R110 Nausea: Secondary | ICD-10-CM | POA: Insufficient documentation

## 2015-06-19 DIAGNOSIS — R109 Unspecified abdominal pain: Secondary | ICD-10-CM | POA: Diagnosis not present

## 2015-06-19 LAB — BASIC METABOLIC PANEL
Anion gap: 10 (ref 5–15)
BUN: 15 mg/dL (ref 6–20)
CO2: 27 mmol/L (ref 22–32)
Calcium: 9.4 mg/dL (ref 8.9–10.3)
Chloride: 101 mmol/L (ref 101–111)
Creatinine, Ser: 1.06 mg/dL — ABNORMAL HIGH (ref 0.44–1.00)
GFR calc Af Amer: 60 mL/min — ABNORMAL LOW (ref >60)
GFR calc non Af Amer: 52 mL/min — ABNORMAL LOW (ref >60)
Glucose, Bld: 126 mg/dL — ABNORMAL HIGH (ref 65–99)
Potassium: 4.1 mmol/L (ref 3.5–5.1)
Sodium: 138 mmol/L (ref 135–145)

## 2015-06-19 LAB — DIFFERENTIAL
Basophils Absolute: 0.1 10*3/uL (ref 0.0–0.1)
Basophils Relative: 0 %
Eosinophils Absolute: 0.2 10*3/uL (ref 0.0–0.7)
Eosinophils Relative: 2 %
Lymphocytes Relative: 22 %
Lymphs Abs: 3.2 10*3/uL (ref 0.7–4.0)
Monocytes Absolute: 1 10*3/uL (ref 0.1–1.0)
Monocytes Relative: 7 %
Neutro Abs: 10.3 10*3/uL — ABNORMAL HIGH (ref 1.7–7.7)
Neutrophils Relative %: 69 %

## 2015-06-19 LAB — CBC
HCT: 34.1 % — ABNORMAL LOW (ref 36.0–46.0)
Hemoglobin: 11.1 g/dL — ABNORMAL LOW (ref 12.0–15.0)
MCH: 27.9 pg (ref 26.0–34.0)
MCHC: 32.6 g/dL (ref 30.0–36.0)
MCV: 85.7 fL (ref 78.0–100.0)
Platelets: 274 10*3/uL (ref 150–400)
RBC: 3.98 MIL/uL (ref 3.87–5.11)
RDW: 13.7 % (ref 11.5–15.5)
WBC: 14.4 10*3/uL — ABNORMAL HIGH (ref 4.0–10.5)

## 2015-06-19 LAB — HEPATIC FUNCTION PANEL
ALT: 26 U/L (ref 14–54)
AST: 30 U/L (ref 15–41)
Albumin: 3.6 g/dL (ref 3.5–5.0)
Alkaline Phosphatase: 111 U/L (ref 38–126)
Bilirubin, Direct: 0.1 mg/dL (ref 0.1–0.5)
Indirect Bilirubin: 0.3 mg/dL (ref 0.3–0.9)
Total Bilirubin: 0.4 mg/dL (ref 0.3–1.2)
Total Protein: 6.7 g/dL (ref 6.5–8.1)

## 2015-06-19 LAB — LIPASE, BLOOD: Lipase: 29 U/L (ref 11–51)

## 2015-06-19 LAB — TROPONIN I: Troponin I: <0.03 ng/mL (ref <0.031)

## 2015-06-19 LAB — D-DIMER, QUANTITATIVE (NOT AT ARMC): D-Dimer, Quant: 0.39 ug/mL-FEU (ref 0.00–0.50)

## 2015-06-19 MED ORDER — SODIUM CHLORIDE 0.9 % IV BOLUS (SEPSIS)
250.0000 mL | Freq: Once | INTRAVENOUS | Status: AC
  Administered 2015-06-19: 250 mL via INTRAVENOUS

## 2015-06-19 MED ORDER — FENTANYL CITRATE (PF) 100 MCG/2ML IJ SOLN
50.0000 ug | Freq: Once | INTRAMUSCULAR | Status: AC
  Administered 2015-06-19: 50 ug via INTRAVENOUS
  Filled 2015-06-19: qty 2

## 2015-06-19 MED ORDER — SUCRALFATE 1 G PO TABS
1.0000 g | ORAL_TABLET | Freq: Once | ORAL | Status: AC
  Administered 2015-06-19: 1 g via ORAL
  Filled 2015-06-19: qty 1

## 2015-06-19 MED ORDER — PANTOPRAZOLE SODIUM 40 MG IV SOLR
40.0000 mg | Freq: Once | INTRAVENOUS | Status: AC
  Administered 2015-06-19: 40 mg via INTRAVENOUS
  Filled 2015-06-19: qty 40

## 2015-06-19 MED ORDER — GI COCKTAIL ~~LOC~~
30.0000 mL | Freq: Once | ORAL | Status: AC
  Administered 2015-06-19: 30 mL via ORAL
  Filled 2015-06-19: qty 30

## 2015-06-19 MED ORDER — IOHEXOL 300 MG/ML  SOLN
100.0000 mL | Freq: Once | INTRAMUSCULAR | Status: AC | PRN
  Administered 2015-06-19: 100 mL via INTRAVENOUS

## 2015-06-19 MED ORDER — ONDANSETRON HCL 4 MG/2ML IJ SOLN
4.0000 mg | Freq: Once | INTRAMUSCULAR | Status: AC
  Administered 2015-06-19: 4 mg via INTRAVENOUS
  Filled 2015-06-19: qty 2

## 2015-06-19 NOTE — Discharge Instructions (Signed)
Increase your Prilosec to 20 mg twice daily with meals for the next week, then resume your regular dose.  Return to the emergency department if you develop worsening pain, bloody stools, high fever, or other new and concerning symptoms.   Abdominal Pain, Adult Many things can cause abdominal pain. Usually, abdominal pain is not caused by a disease and will improve without treatment. It can often be observed and treated at home. Your health care provider will do a physical exam and possibly order blood tests and X-rays to help determine the seriousness of your pain. However, in many cases, more time must pass before a clear cause of the pain can be found. Before that point, your health care provider may not know if you need more testing or further treatment. HOME CARE INSTRUCTIONS Monitor your abdominal pain for any changes. The following actions may help to alleviate any discomfort you are experiencing:  Only take over-the-counter or prescription medicines as directed by your health care provider.  Do not take laxatives unless directed to do so by your health care provider.  Try a clear liquid diet (broth, tea, or water) as directed by your health care provider. Slowly move to a bland diet as tolerated. SEEK MEDICAL CARE IF:  You have unexplained abdominal pain.  You have abdominal pain associated with nausea or diarrhea.  You have pain when you urinate or have a bowel movement.  You experience abdominal pain that wakes you in the night.  You have abdominal pain that is worsened or improved by eating food.  You have abdominal pain that is worsened with eating fatty foods.  You have a fever. SEEK IMMEDIATE MEDICAL CARE IF:  Your pain does not go away within 2 hours.  You keep throwing up (vomiting).  Your pain is felt only in portions of the abdomen, such as the right side or the left lower portion of the abdomen.  You pass bloody or black tarry stools. MAKE SURE  YOU:  Understand these instructions.  Will watch your condition.  Will get help right away if you are not doing well or get worse.   This information is not intended to replace advice given to you by your health care provider. Make sure you discuss any questions you have with your health care provider.   Document Released: 01/02/2005 Document Revised: 12/14/2014 Document Reviewed: 12/02/2012 Elsevier Interactive Patient Education [employer].

## 2015-06-19 NOTE — ED Notes (Signed)
Dr. Molpus into room 

## 2015-06-19 NOTE — ED Provider Notes (Signed)
Care assumed from Dr. Florina Ou at shift change. Patient presented with complaints of upper abdominal and lower chest discomfort that started yesterday evening. Her workup revealed no evidence for a cardiac etiology. She did have a mild leukocytosis and tenderness in the epigastric region, prompting Dr. Florina Ou to order a CT scan. This was performed and was essentially unremarkable. There were findings consistent with acid reflux, however no emergent intra-abdominal pathology. The patient is now feeling significantly improved and reports that her discomfort is gone. She will be discharged with an increased dose of her Prilosec for the next week and when necessary return if her symptoms significantly worsen or change.  Veryl Speak, MD 06/19/15 [phone]

## 2015-06-19 NOTE — ED Provider Notes (Signed)
CSN: [id number]     Arrival date & time 06/19/15  0202 History   First MD Initiated Contact with Patient 06/19/15 0353     Chief Complaint  Patient presents with  . Chest Pain     (Consider location/radiation/quality/duration/timing/severity/associated sxs/prior Treatment) HPI  This is a 70 year old female with lower substernal pain that began about 9 PM yesterday evening. The pain is sharp and well localized. It is worse with exhalation. There is associated nausea but no shortness of breath or vomiting. She took 324 milligrams of aspirin prior to arrival without relief.  Past Medical History  Diagnosis Date  . Attention deficit disorder with hyperactivity(314.01)   . Other chronic cystitis   . Posttraumatic stress disorder     followed by Dr Lovena Le, psychiatry  . Depressive disorder, not elsewhere classified   . Esophageal reflux   . Stricture and stenosis of esophagus   . Other and unspecified hyperlipidemia   . Unspecified hypothyroidism   . Insomnia   . Osteoarthritis   . Migraine   . Obstructive sleep apnea   . Temporomandibular joint disorders, unspecified   . Thoracic spondylosis without myelopathy   . Panic attacks   . Osteoporosis   . HLD (hyperlipidemia)   . Dysrhythmia   . Kidney stones   . Complication of anesthesia     shaking after sugery in the 1980's   Past Surgical History  Procedure Laterality Date  . Appendectomy    . Cholecystectomy    . Tubal ligation    . Bladder surgery    . Eye surgery    . Abdominal hysterectomy    . Tonsillectomy    . Colonoscopy    . Lumbar laminectomy/decompression microdiscectomy Right 01/28/2013    Procedure: LUMBAR LAMINECTOMY/DECOMPRESSION MICRODISCECTOMY LUMBAR FOUR-FIVE;  Surgeon: Faythe Ghee, MD;  Location: [facility];  Service: Neurosurgery;  Laterality: Right;  . Bladder tack    . Transforaminal lumbar interbody fusion (tlif) with pedicle screw fixation 1 level Right 11/25/2013    Procedure: TRANSFORAMINAL  LUMBAR INTERBODY FUSION (TLIF) WITH PEDICLE SCREW FIXATION 1 LEVEL;  Surgeon: Faythe Ghee, MD;  Location: [facility];  Service: Neurosurgery;  Laterality: Right;  TRANSFORAMINAL LUMBAR INTERBODY FUSION (TLIF) WITH PEDICLE SCREW FIXATION 1 LEVEL LUMBAR 4-5   Family History  Problem Relation Age of Onset  . Heart attack Father   . Heart failure Father   . Liver disease Father   . Heart disease Father   . COPD Mother   . Depression Mother   . Diabetes Mother   . Crohn's disease Son    Social History  Substance Use Topics  . Smoking status: Former Smoker -- 0.50 packs/day for 20 years    Types: Cigarettes  . Smokeless tobacco: Never Used  . Alcohol Use: No   OB History    No data available     Review of Systems  All other systems reviewed and are negative.   Allergies  Tetracycline and Lactose intolerance (gi)  Home Medications   Prior to Admission medications   Medication Sig Start Date End Date Taking? Authorizing Provider  DULoxetine (CYMBALTA) 60 MG capsule Take 60 mg by mouth daily.   Yes Historical Provider, MD  sertraline (ZOLOFT) 100 MG tablet Take 100 mg by mouth daily.   Yes Historical Provider, MD  traZODone (DESYREL) 100 MG tablet Take 100 mg by mouth at bedtime.   Yes Historical Provider, MD  aspirin 81 MG tablet Take 81 mg by mouth  daily.    Historical Provider, MD  diphenhydrAMINE (BENADRYL) 25 MG tablet Take 1 tablet (25 mg total) by mouth every 6 (six) hours as needed. For 3 days 12/12/13   Ernestina Patches, MD  famotidine (PEPCID) 20 MG tablet Take 1 tablet (20 mg total) by mouth 2 (two) times daily. For 3 days 12/12/13   Ernestina Patches, MD  hydrOXYzine (ATARAX/VISTARIL) 10 MG tablet Take 10 mg by mouth every 4 (four) hours as needed for itching or anxiety.  02/27/11   Historical Provider, MD  levothyroxine (SYNTHROID, LEVOTHROID) 100 MCG tablet Take 50 mcg by mouth daily.     Historical Provider, MD  memantine (NAMENDA) 10 MG tablet Take 10 mg by mouth daily.  Take 1/2 a day for a week, then 1 a day 08/26/13   Historical Provider, MD  Omeprazole (RA OMEPRAZOLE) 20 MG TBEC Take 20 mg by mouth daily. Frequency:   Dosage:0   MG  Instructions:  Note:TAKE 1 A DAY AS NEEDED 04/12/10   Historical Provider, MD  oxyCODONE-acetaminophen (PERCOCET/ROXICET) 5-325 MG per tablet Take 1-2 tablets by mouth every 4 (four) hours as needed for moderate pain. 11/26/13   Karie Chimera, MD  pravastatin (PRAVACHOL) 20 MG tablet Take 20 mg by mouth daily.  08/31/13 08/31/14  Historical Provider, MD  predniSONE (DELTASONE) 10 MG tablet Take 2 tablets (20 mg total) by mouth daily. For 3 days 12/12/13   Ernestina Patches, MD  Travoprost, BAK Free, (TRAVATAN) 0.004 % SOLN ophthalmic solution Place 1 drop into both eyes at bedtime.    Historical Provider, MD  Vitamin D, Ergocalciferol, (DRISDOL) 50000 UNITS CAPS capsule Take 50,000 Units by mouth every 7 (seven) days. saturdays    Historical Provider, MD   BP 98/57 mmHg  Pulse 67  Temp(Src) 97.5 F (36.4 C) (Oral)  Resp 16  Ht 5\' 1"  (1.549 m)  Wt 150 lb (68.04 kg)  BMI 28.36 kg/m2  SpO2 100%   Physical Exam  General: Well-developed, well-nourished female in no acute distress; appearance consistent with age of record HENT: normocephalic; atraumatic Eyes: pupils equal, round and reactive to light; extraocular muscles intact Neck: supple Heart: regular rate and rhythm Lungs: clear to auscultation bilaterally Abdomen: soft; nondistended; epigastric and left upper quadrant tenderness that reproduces the pain of chief complaint; no masses or hepatosplenomegaly; bowel sounds present Extremities: No deformity; full range of motion; pulses normal Neurologic: Awake, alert and oriented; motor function intact in all extremities and symmetric; no facial droop Skin: Warm and dry Psychiatric: Flat affect    ED Course  Procedures (including critical care time)   EKG Interpretation   Date/Time:  Monday June 19 2015 02:13:00  EDT Ventricular Rate:  72 PR Interval:  133 QRS Duration: 100 QT Interval:  454 QTC Calculation: 497 R Axis:   24 Text Interpretation:  Sinus rhythm Low voltage, precordial leads  Borderline T abnormalities, anterior leads No significant change was found  Confirmed by Florina Ou  MD, Jenny Reichmann ([id number]) on 06/19/2015 3:55:40 AM Also  confirmed by Florina Ou  MD, Jenny Reichmann ([id number]), editor [place]  CCT, BEVERLY  ([id number])  on 06/19/2015 5:55:31 AM      MDM   Nursing notes and vitals signs, including pulse oximetry, reviewed.  Summary of this visit's results, reviewed by myself:  Labs:  Results for orders placed or performed during the hospital encounter of 06/19/15 (from the past 24 hour(s))  Basic metabolic panel     Status: Abnormal   Collection Time: 06/19/15  3:12 AM  Result  Value Ref Range   Sodium 138 135 - 145 mmol/L   Potassium 4.1 3.5 - 5.1 mmol/L   Chloride 101 101 - 111 mmol/L   CO2 27 22 - 32 mmol/L   Glucose, Bld 126 (H) 65 - 99 mg/dL   BUN 15 6 - 20 mg/dL   Creatinine, Ser 1.06 (H) 0.44 - 1.00 mg/dL   Calcium 9.4 8.9 - 10.3 mg/dL   GFR calc non Af Amer 52 (L) >60 mL/min   GFR calc Af Amer 60 (L) >60 mL/min   Anion gap 10 5 - 15  CBC     Status: Abnormal   Collection Time: 06/19/15  3:12 AM  Result Value Ref Range   WBC 14.4 (H) 4.0 - 10.5 K/uL   RBC 3.98 3.87 - 5.11 MIL/uL   Hemoglobin 11.1 (L) 12.0 - 15.0 g/dL   HCT 34.1 (L) 36.0 - 46.0 %   MCV 85.7 78.0 - 100.0 fL   MCH 27.9 26.0 - 34.0 pg   MCHC 32.6 30.0 - 36.0 g/dL   RDW 13.7 11.5 - 15.5 %   Platelets 274 150 - 400 K/uL  Troponin I     Status: None   Collection Time: 06/19/15  3:12 AM  Result Value Ref Range   Troponin I <0.03 <0.031 ng/mL  D-dimer, quantitative (not at [facility])     Status: None   Collection Time: 06/19/15  3:12 AM  Result Value Ref Range   D-Dimer, Quant 0.39 0.00 - 0.50 ug/mL-FEU  Lipase, blood     Status: None   Collection Time: 06/19/15  3:12 AM  Result Value Ref Range   Lipase 29 11 - 51 U/L   Hepatic function panel     Status: None   Collection Time: 06/19/15  3:12 AM  Result Value Ref Range   Total Protein 6.7 6.5 - 8.1 g/dL   Albumin 3.6 3.5 - 5.0 g/dL   AST 30 15 - 41 U/L   ALT 26 14 - 54 U/L   Alkaline Phosphatase 111 38 - 126 U/L   Total Bilirubin 0.4 0.3 - 1.2 mg/dL   Bilirubin, Direct 0.1 0.1 - 0.5 mg/dL   Indirect Bilirubin 0.3 0.3 - 0.9 mg/dL  Differential     Status: Abnormal   Collection Time: 06/19/15  3:12 AM  Result Value Ref Range   Neutrophils Relative % 69 %   Neutro Abs 10.3 (H) 1.7 - 7.7 K/uL   Lymphocytes Relative 22 %   Lymphs Abs 3.2 0.7 - 4.0 K/uL   Monocytes Relative 7 %   Monocytes Absolute 1.0 0.1 - 1.0 K/uL   Eosinophils Relative 2 %   Eosinophils Absolute 0.2 0.0 - 0.7 K/uL   Basophils Relative 0 %   Basophils Absolute 0.1 0.0 - 0.1 K/uL    Imaging Studies: Dg Chest 2 View  06/19/2015  CLINICAL DATA:  Acute onset of midsternal chest pain and difficulty breathing. Initial encounter. EXAM: CHEST  2 VIEW COMPARISON:  Chest radiograph from 01/25/2013 FINDINGS: The lungs are well-aerated and clear. There is no evidence of focal opacification, pleural effusion or pneumothorax. The heart is normal in size; the mediastinal contour is within normal limits. No acute osseous abnormalities are seen. Clips are noted within the right upper quadrant, reflecting prior cholecystectomy. IMPRESSION: No acute cardiopulmonary process seen. Electronically Signed   By: Garald Balding M.D.   On: 06/19/2015 02:51   Ct Abdomen Pelvis W Contrast  06/19/2015  CLINICAL DATA:  bilateral flank  pain and epigastrica pain since 9 pm on Sunday night, some nausea, appy, chole., tubal, hysterectomy, bladder tack, lumbar surgery, hx of kidney stones, dysrhythmia, reflux. EXAM: CT ABDOMEN AND PELVIS WITH CONTRAST TECHNIQUE: Multidetector CT imaging of the abdomen and pelvis was performed using the standard protocol following bolus administration of intravenous contrast. CONTRAST:   164mL OMNIPAQUE IOHEXOL 300 MG/ML  SOLN COMPARISON:  Report from 02/28/2010 FINDINGS: Lower chest: Upper normal heart size. contrast medium in the distal esophagus. Hepatobiliary: Cholecystectomy.  Common bile duct 9 mm. Pancreas: Unremarkable Spleen: Unremarkable Adrenals/Urinary Tract: Mild urinary bladder wall thickening may be from nondistention. No hydronephrosis or hydroureter. 10 mm hypodense lesion of the left mid upper kidney, fluid density, likely a cyst. 5 mm hypodensity of the left lower kidney statistically likely to be a cyst but technically nonspecific due to small size. Stomach/Bowel: Mildly redundant sigmoid colon.  Appendectomy. Vascular/Lymphatic: Aortoiliac atherosclerotic vascular disease. Reproductive: Uterus absent.  Ovarian remnants unremarkable. Other: No supplemental non-categorized findings. Musculoskeletal: Right posterolateral rod and pedicle screw fixation at the L4-5 level. Disc bulge at L3-4 but without overt impingement. There is evidence of pelvic floor laxity. IMPRESSION: 1. Contrast medium in the distal esophagus which could be from dysmotility or reflux. 2. Common bile duct 9 mm in the setting of cholecystectomy, possibly a physiologic response. 3.  Aortoiliac atherosclerotic vascular disease. 4. Disc bulge at L3-4 without overt impingement. 5. Pelvic floor laxity. Electronically Signed   By: Van Clines M.D.   On: 06/19/2015 07:45   6:26 AM No significant relief with oral Carafate or GI cocktail. Pain has been improved with fentanyl. We will obtain CT of the abdomen and pelvis.      Shanon Rosser, MD 06/19/15 [phone]

## 2015-06-19 NOTE — ED Notes (Addendum)
C/o midsternal cp that start at 2100. States pain is worse when she "breathes out" denies any n/v or sob. Denies any recent illness. Describes pain as sharp when she breaths out. Denies any cardiac history. Pt took 4 baby ASA prior to arrival.

## 2015-06-23 DIAGNOSIS — K219 Gastro-esophageal reflux disease without esophagitis: Secondary | ICD-10-CM | POA: Diagnosis not present

## 2015-06-23 DIAGNOSIS — S299XXA Unspecified injury of thorax, initial encounter: Secondary | ICD-10-CM | POA: Diagnosis not present

## 2015-06-23 DIAGNOSIS — R0781 Pleurodynia: Secondary | ICD-10-CM | POA: Diagnosis not present

## 2015-06-23 DIAGNOSIS — R1013 Epigastric pain: Secondary | ICD-10-CM | POA: Diagnosis not present

## 2015-07-03 DIAGNOSIS — F330 Major depressive disorder, recurrent, mild: Secondary | ICD-10-CM | POA: Diagnosis not present

## 2015-07-22 ENCOUNTER — Emergency Department (HOSPITAL_BASED_OUTPATIENT_CLINIC_OR_DEPARTMENT_OTHER): Payer: PPO

## 2015-07-22 ENCOUNTER — Other Ambulatory Visit: Payer: Self-pay

## 2015-07-22 ENCOUNTER — Encounter (HOSPITAL_BASED_OUTPATIENT_CLINIC_OR_DEPARTMENT_OTHER): Payer: Self-pay | Admitting: Emergency Medicine

## 2015-07-22 ENCOUNTER — Observation Stay (HOSPITAL_BASED_OUTPATIENT_CLINIC_OR_DEPARTMENT_OTHER)
Admission: EM | Admit: 2015-07-22 | Discharge: 2015-07-23 | Disposition: A | Discharge status: Home / Self Care | Payer: PPO | Attending: Internal Medicine | Admitting: Internal Medicine

## 2015-07-22 DIAGNOSIS — R0602 Shortness of breath: Principal | ICD-10-CM | POA: Insufficient documentation

## 2015-07-22 DIAGNOSIS — Z7982 Long term (current) use of aspirin: Secondary | ICD-10-CM | POA: Diagnosis not present

## 2015-07-22 DIAGNOSIS — R0600 Dyspnea, unspecified: Secondary | ICD-10-CM | POA: Diagnosis not present

## 2015-07-22 DIAGNOSIS — Z79899 Other long term (current) drug therapy: Secondary | ICD-10-CM | POA: Diagnosis not present

## 2015-07-22 DIAGNOSIS — F419 Anxiety disorder, unspecified: Secondary | ICD-10-CM | POA: Diagnosis not present

## 2015-07-22 DIAGNOSIS — F410 Panic disorder [episodic paroxysmal anxiety] without agoraphobia: Secondary | ICD-10-CM | POA: Insufficient documentation

## 2015-07-22 DIAGNOSIS — N183 Chronic kidney disease, stage 3 unspecified: Secondary | ICD-10-CM | POA: Diagnosis present

## 2015-07-22 DIAGNOSIS — R0789 Other chest pain: Secondary | ICD-10-CM | POA: Diagnosis not present

## 2015-07-22 DIAGNOSIS — E785 Hyperlipidemia, unspecified: Secondary | ICD-10-CM | POA: Diagnosis not present

## 2015-07-22 DIAGNOSIS — E039 Hypothyroidism, unspecified: Secondary | ICD-10-CM | POA: Diagnosis not present

## 2015-07-22 DIAGNOSIS — R071 Chest pain on breathing: Secondary | ICD-10-CM | POA: Diagnosis not present

## 2015-07-22 DIAGNOSIS — R079 Chest pain, unspecified: Secondary | ICD-10-CM | POA: Diagnosis not present

## 2015-07-22 DIAGNOSIS — Z87891 Personal history of nicotine dependence: Secondary | ICD-10-CM | POA: Diagnosis not present

## 2015-07-22 DIAGNOSIS — D649 Anemia, unspecified: Secondary | ICD-10-CM | POA: Diagnosis present

## 2015-07-22 DIAGNOSIS — I959 Hypotension, unspecified: Secondary | ICD-10-CM | POA: Diagnosis present

## 2015-07-22 LAB — CBC
HCT: 30.1 % — ABNORMAL LOW (ref 36.0–46.0)
Hemoglobin: 9.8 g/dL — ABNORMAL LOW (ref 12.0–15.0)
MCH: 26.7 pg (ref 26.0–34.0)
MCHC: 32.6 g/dL (ref 30.0–36.0)
MCV: 82 fL (ref 78.0–100.0)
Platelets: 232 10*3/uL (ref 150–400)
RBC: 3.67 MIL/uL — ABNORMAL LOW (ref 3.87–5.11)
RDW: 13.9 % (ref 11.5–15.5)
WBC: 9.5 10*3/uL (ref 4.0–10.5)

## 2015-07-22 LAB — COMPREHENSIVE METABOLIC PANEL
ALT: 26 U/L (ref 14–54)
AST: 29 U/L (ref 15–41)
Albumin: 3.6 g/dL (ref 3.5–5.0)
Alkaline Phosphatase: 121 U/L (ref 38–126)
Anion gap: 8 (ref 5–15)
BUN: 18 mg/dL (ref 6–20)
CO2: 25 mmol/L (ref 22–32)
Calcium: 9.1 mg/dL (ref 8.9–10.3)
Chloride: 106 mmol/L (ref 101–111)
Creatinine, Ser: 1.1 mg/dL — ABNORMAL HIGH (ref 0.44–1.00)
GFR calc Af Amer: 57 mL/min — ABNORMAL LOW (ref >60)
GFR calc non Af Amer: 49 mL/min — ABNORMAL LOW (ref >60)
Glucose, Bld: 104 mg/dL — ABNORMAL HIGH (ref 65–99)
Potassium: 3.5 mmol/L (ref 3.5–5.1)
Sodium: 139 mmol/L (ref 135–145)
Total Bilirubin: 0.5 mg/dL (ref 0.3–1.2)
Total Protein: 6.7 g/dL (ref 6.5–8.1)

## 2015-07-22 LAB — CBC WITH DIFFERENTIAL/PLATELET
Basophils Absolute: 0 10*3/uL (ref 0.0–0.1)
Basophils Relative: 0 %
Eosinophils Absolute: 0.1 10*3/uL (ref 0.0–0.7)
Eosinophils Relative: 1 %
HCT: 33.2 % — ABNORMAL LOW (ref 36.0–46.0)
Hemoglobin: 11.2 g/dL — ABNORMAL LOW (ref 12.0–15.0)
Lymphocytes Relative: 34 %
Lymphs Abs: 3.4 10*3/uL (ref 0.7–4.0)
MCH: 28 pg (ref 26.0–34.0)
MCHC: 33.7 g/dL (ref 30.0–36.0)
MCV: 83 fL (ref 78.0–100.0)
Monocytes Absolute: 0.6 10*3/uL (ref 0.1–1.0)
Monocytes Relative: 6 %
Neutro Abs: 5.8 10*3/uL (ref 1.7–7.7)
Neutrophils Relative %: 59 %
Platelets: 289 10*3/uL (ref 150–400)
RBC: 4 MIL/uL (ref 3.87–5.11)
RDW: 14 % (ref 11.5–15.5)
WBC: 10 10*3/uL (ref 4.0–10.5)

## 2015-07-22 LAB — TROPONIN I
Troponin I: <0.03 ng/mL (ref <0.031)
Troponin I: <0.03 ng/mL (ref <0.031)

## 2015-07-22 LAB — CREATININE, SERUM
Creatinine, Ser: 1.04 mg/dL — ABNORMAL HIGH (ref 0.44–1.00)
GFR calc Af Amer: >60 mL/min (ref >60)
GFR calc non Af Amer: 53 mL/min — ABNORMAL LOW (ref >60)

## 2015-07-22 LAB — BRAIN NATRIURETIC PEPTIDE: B Natriuretic Peptide: 109.3 pg/mL — ABNORMAL HIGH (ref 0.0–100.0)

## 2015-07-22 MED ORDER — MEMANTINE HCL 10 MG PO TABS
10.0000 mg | ORAL_TABLET | Freq: Every day | ORAL | Status: DC
  Administered 2015-07-23: 10 mg via ORAL
  Filled 2015-07-22: qty 1

## 2015-07-22 MED ORDER — VENLAFAXINE HCL ER 75 MG PO CP24
75.0000 mg | ORAL_CAPSULE | Freq: Every day | ORAL | Status: DC
  Administered 2015-07-23: 75 mg via ORAL
  Filled 2015-07-22: qty 1

## 2015-07-22 MED ORDER — LEVOTHYROXINE SODIUM 50 MCG PO TABS
50.0000 ug | ORAL_TABLET | Freq: Every day | ORAL | Status: DC
  Administered 2015-07-23: 50 ug via ORAL
  Filled 2015-07-22: qty 1

## 2015-07-22 MED ORDER — HYDROXYZINE HCL 25 MG PO TABS
100.0000 mg | ORAL_TABLET | Freq: Once | ORAL | Status: AC
  Administered 2015-07-22: 100 mg via ORAL
  Filled 2015-07-22: qty 4

## 2015-07-22 MED ORDER — ACETAMINOPHEN 650 MG RE SUPP: 650.0000 mg | Freq: Four times a day (QID) | RECTAL | Status: DC | PRN

## 2015-07-22 MED ORDER — LORAZEPAM 1 MG PO TABS
1.0000 mg | ORAL_TABLET | Freq: Once | ORAL | Status: AC
  Administered 2015-07-22: 1 mg via ORAL
  Filled 2015-07-22: qty 1

## 2015-07-22 MED ORDER — SERTRALINE HCL 100 MG PO TABS
100.0000 mg | ORAL_TABLET | Freq: Every day | ORAL | Status: DC
  Administered 2015-07-23: 100 mg via ORAL
  Filled 2015-07-22: qty 1

## 2015-07-22 MED ORDER — SODIUM CHLORIDE 0.9 % IV BOLUS (SEPSIS)
500.0000 mL | Freq: Once | INTRAVENOUS | Status: AC
  Administered 2015-07-22: 500 mL via INTRAVENOUS

## 2015-07-22 MED ORDER — ACETAMINOPHEN 325 MG PO TABS
650.0000 mg | ORAL_TABLET | Freq: Four times a day (QID) | ORAL | Status: DC | PRN
  Administered 2015-07-23: 650 mg via ORAL
  Filled 2015-07-22: qty 2

## 2015-07-22 MED ORDER — ASPIRIN EC 81 MG PO TBEC
81.0000 mg | DELAYED_RELEASE_TABLET | Freq: Every day | ORAL | Status: DC
  Administered 2015-07-23: 81 mg via ORAL
  Filled 2015-07-22: qty 1

## 2015-07-22 MED ORDER — ALBUTEROL SULFATE (2.5 MG/3ML) 0.083% IN NEBU
2.5000 mg | INHALATION_SOLUTION | Freq: Once | RESPIRATORY_TRACT | Status: AC
  Administered 2015-07-22: 2.5 mg via RESPIRATORY_TRACT
  Filled 2015-07-22: qty 3

## 2015-07-22 MED ORDER — PANTOPRAZOLE SODIUM 40 MG PO TBEC
40.0000 mg | DELAYED_RELEASE_TABLET | Freq: Every day | ORAL | Status: DC
  Administered 2015-07-23: 40 mg via ORAL
  Filled 2015-07-22: qty 1

## 2015-07-22 MED ORDER — ENOXAPARIN SODIUM 40 MG/0.4ML ~~LOC~~ SOLN
40.0000 mg | SUBCUTANEOUS | Status: DC
  Administered 2015-07-23: 40 mg via SUBCUTANEOUS
  Filled 2015-07-22: qty 0.4

## 2015-07-22 MED ORDER — TRAZODONE HCL 100 MG PO TABS
100.0000 mg | ORAL_TABLET | Freq: Every day | ORAL | Status: DC
  Administered 2015-07-22: 100 mg via ORAL
  Filled 2015-07-22: qty 1

## 2015-07-22 MED ORDER — SODIUM CHLORIDE 0.9 % IV BOLUS (SEPSIS)
1000.0000 mL | Freq: Once | INTRAVENOUS | Status: AC
  Administered 2015-07-22: 1000 mL via INTRAVENOUS

## 2015-07-22 MED ORDER — IOPAMIDOL (ISOVUE-370) INJECTION 76%
100.0000 mL | Freq: Once | INTRAVENOUS | Status: AC | PRN
  Administered 2015-07-22: 100 mL via INTRAVENOUS

## 2015-07-22 MED ORDER — ASPIRIN 81 MG PO CHEW
243.0000 mg | CHEWABLE_TABLET | Freq: Once | ORAL | Status: AC
  Administered 2015-07-22: 243 mg via ORAL
  Filled 2015-07-22: qty 3

## 2015-07-22 MED ORDER — PRAVASTATIN SODIUM 20 MG PO TABS
20.0000 mg | ORAL_TABLET | Freq: Every day | ORAL | Status: DC
Start: 2015-07-23 — End: 2015-07-23
  Administered 2015-07-23: 20 mg via ORAL
  Filled 2015-07-22: qty 1

## 2015-07-22 NOTE — ED Notes (Signed)
Attempt to call report to [facility] . Nurse not available  at present time. [facility] on scene .

## 2015-07-22 NOTE — ED Notes (Signed)
Patient transported to CT 

## 2015-07-22 NOTE — ED Notes (Signed)
Report called to [facility] [place] spoke with Psychologist, clinical .

## 2015-07-22 NOTE — H&P (Signed)
History and Physical  Patient Name: Brianna [name]     [id number]    DOB: [birth year]    DOA: 07/22/2015 Referring provider: Lonia Skinner, MD PCP: Robyne Peers., MD  Outpatient specialists: Lonia Skinner, MD Patient coming from: Home  Chief Complaint: Dyspnea  HPI: Brianna [name] is a 70 y.o. female with a past medical history significant for hypothyroidism, OSA not on CPAP, depression/anxiety, IBS, chronic cystitis, migraines, and PTSD who presents with dyspnea.  History is collected from the patient and her daughter both. Evidently she was in her normal state of health until about one 2 weeks ago when she started to develop slowly, gradually worse, shortness of breath. She describes this as "inability get a breath in". There has been no fever, cough, sputum. There is been no stridor or wheezing. She has chronic dizzy spells, which are unchanged. There is a new rib pain, bilaterally under the breasts, but the patient relates his caused by her having to breathe so hard. She has today some neck discomfort, like her neck is tight. The sensation of her breathing feels similar she reports to previous panic attack.  In the ED, the patient was initially reportedly hypotensive, got 1.5 L fluids, and her blood pressure reportedly improved, although narrow pulse pressures are recorded.  Na 139, K 3.5, Cr 1.1, WBC 10K, BNP 109, Troponin negative, ECG unchanged from previous.  CXR was clear and CTA showed no PE, dissection or parenchymal abnormality, other than a small pleural nodule (for which outpatient follow up is recommended).  She was given hydroxyzine and TRH were asked to accept in transfer by telephone for chest pain and dyspnea.  At baseline, she is sedentary.  She cannot climb stairs well, and would need a cane or the support of a family member to go out of the house.        Review of Systems:  Pt complains of shortness of breath, rib discomfort bilateral, neck discomfort, dizzy Spells,  anxiety. Pt denies any orthopnea, leg swelling, paroxysmal nocturnal dyspnea. Denies fever, sputum, chills, cough. Denies stridor, wheezing, exertional symptoms.  All other systems negative except as just noted or noted in the history of present illness.    Past Medical History  Diagnosis Date  . Attention deficit disorder with hyperactivity(314.01)   . Other chronic cystitis   . Posttraumatic stress disorder     followed by Dr Lovena Le, psychiatry  . Depressive disorder, not elsewhere classified   . Esophageal reflux   . Stricture and stenosis of esophagus   . Other and unspecified hyperlipidemia   . Unspecified hypothyroidism   . Insomnia   . Osteoarthritis   . Migraine   . Obstructive sleep apnea   . Temporomandibular joint disorders, unspecified   . Thoracic spondylosis without myelopathy   . Panic attacks   . Osteoporosis   . HLD (hyperlipidemia)   . Dysrhythmia   . Kidney stones   . Complication of anesthesia     shaking after sugery in the 1980's    Past Surgical History  Procedure Laterality Date  . Appendectomy    . Cholecystectomy    . Tubal ligation    . Bladder surgery    . Eye surgery    . Abdominal hysterectomy    . Tonsillectomy    . Colonoscopy    . Lumbar laminectomy/decompression microdiscectomy Right 01/28/2013    Procedure: LUMBAR LAMINECTOMY/DECOMPRESSION MICRODISCECTOMY LUMBAR FOUR-FIVE;  Surgeon: Faythe Ghee, MD;  Location: [facility];  Service: Neurosurgery;  Laterality: Right;  . Bladder tack    . Transforaminal lumbar interbody fusion (tlif) with pedicle screw fixation 1 level Right 11/25/2013    Procedure: TRANSFORAMINAL LUMBAR INTERBODY FUSION (TLIF) WITH PEDICLE SCREW FIXATION 1 LEVEL;  Surgeon: Faythe Ghee, MD;  Location: [place] [facility];  Service: Neurosurgery;  Laterality: Right;  TRANSFORAMINAL LUMBAR INTERBODY FUSION (TLIF) WITH PEDICLE SCREW FIXATION 1 LEVEL LUMBAR 4-5    Social History: Patient lives with her husband.  Their son  lives with them and helps them out.  She walks with a cane or support of a family member if she goes out.  She has mild cognitive impairment, does not perform some IADLs, but no dementia and is independent with ADLs.  She smoked remotely.        Allergies  Allergen Reactions  . Tetracycline Swelling  . Lactose Intolerance (Gi) Nausea And Vomiting    Family history: family history includes COPD in her mother; Crohn's disease in her son; Depression in her mother; Diabetes in her mother; Heart attack in her father; Heart disease in her father; Heart failure in her father; Liver disease in her father.  Prior to Admission medications   Medication Sig Start Date End Date Taking? Authorizing Provider  aspirin 81 MG tablet Take 81 mg by mouth daily.   Yes Historical Provider, MD  desvenlafaxine (PRISTIQ) 50 MG 24 hr tablet Take 50 mg by mouth daily.   Yes Historical Provider, MD  hydrOXYzine (ATARAX/VISTARIL) 10 MG tablet Take 10 mg by mouth every 4 (four) hours as needed for itching or anxiety.  02/27/11  Yes Historical Provider, MD  levothyroxine (SYNTHROID, LEVOTHROID) 100 MCG tablet Take 50 mcg by mouth daily.    Yes Historical Provider, MD  memantine (NAMENDA) 10 MG tablet Take 10 mg by mouth daily. Take 1/2 a day for a week, then 1 a day 08/26/13  Yes Historical Provider, MD  Omeprazole (RA OMEPRAZOLE) 20 MG TBEC Take 20 mg by mouth daily. Frequency:   Dosage:0   MG  Instructions:  Note:TAKE 1 A DAY AS NEEDED 04/12/10  Yes Historical Provider, MD  sertraline (ZOLOFT) 100 MG tablet Take 100 mg by mouth daily.   Yes Historical Provider, MD  Travoprost, BAK Free, (TRAVATAN) 0.004 % SOLN ophthalmic solution Place 1 drop into both eyes at bedtime.   Yes Historical Provider, MD  traZODone (DESYREL) 100 MG tablet Take 100 mg by mouth at bedtime.   Yes Historical Provider, MD  Vitamin D, Ergocalciferol, (DRISDOL) 50000 UNITS CAPS capsule Take 50,000 Units by mouth every 7 (seven) days. saturdays   Yes  Historical Provider, MD  pravastatin (PRAVACHOL) 20 MG tablet Take 20 mg by mouth daily.  08/31/13 08/31/14  Historical Provider, MD       Physical Exam: BP 120/50 mmHg  Pulse 68  Temp(Src) 97.8 F (36.6 C) (Oral)  Resp 24  Ht 5\' 1"  (1.549 m)  Wt 68.72 kg (151 lb 8 oz)  BMI 28.64 kg/m2  SpO2 100% General appearance: Well-developed, elderly adult female, alert and in mild distress from dyspnea.  Keeps head held to side, eyes closed, some pursed lip breathing.   Eyes: Anicteric, conjunctiva pink, lids and lashes normal.     ENT: No nasal deformity, discharge, or epistaxis.  OP moist without lesions.   No thyromegaly. Lymph: No cervical or supraclavicular lymphadenopathy. Skin: Warm and dry.  No jaundice.  No suspicious rashes or lesions. Cardiac: RRR, nl S1-S2, no murmurs appreciated.  Capillary refill is brisk.  JVP normal.  No LE edema.  Radial and DP pulses 2+ and symmetric. Respiratory: Somewhat deep frequent breaths with pursed lips.  But completely CTAB, good air movement without rales or wheezes. Abdomen: Abdomen soft without rigidity.  No TTP. No ascites, distension.   MSK: No deformities or effusions.  There is tenderness over LEFT ribs, not right, where fractures are.   Neuro: Cranial nerves normal.  Sensorium intact and responding to questions, attention normal.  Speech is fluent.  Moves all extremities equally and with normal coordination.    Psych: Affect constrained, anxious.  No evidence of aural or visual hallucinations or delusions.       Labs on Admission:  I have personally reviewed the following studies: The metabolic panel shows normal electrolytes and stable CKD stage III. Transaminases and bilirubin are normal. BNP is minimally elevated. Troponin is negative. The complete blood count shows no leukocytosis, chronic normocytic anemia, no thrombocytopenia.   Radiological Exams on Admission: Personally reviewed: Dg Chest 2 View 07/22/2015  Clear   Ct Angio  Chest Pe W/cm &/or Wo Cm 07/22/2015 IMPRESSION: 1. No pulmonary embolism. 2. Nondisplaced fractures of the anterior aspect of the right third, fourth and fifth ribs. There is minimal callus associated with these fractures suggesting relatively recent injuries within the last month or so. 3. Lenticular shaped pleural-based 2.4 x 1.2 cm lesion in the posterior aspect of the right hemithorax, favored to represent a benign lesion such as a solitary fibrous tumor of the pleura. However, further evaluation with repeat chest CT in the next 3-6 months or PET-CT is suggested to ensure the benign nature of this finding. 4. Atherosclerosis, including left anterior descending coronary artery disease. Assessment for potential risk factor modification, dietary therapy or pharmacologic therapy may be warranted, if clinically indicated.    EKG: Independently reviewed. Rate 75, sinus rhythm.  QTc 446.  Diffuse TW flattening, old.    Assessment/Plan 1. Dyspnea:  Unclear etiology.  PE, pneumonia, dissection, interstitial lung disease, PTX, pulmonary edema and pleural effusion have been ruled out.  COPD is doubted but is possible given mother's history and hx of smoking.  Workup could be as outpatient.  Some progression of her OSA (not on CPAP) could also be contributing?  Increased anxiety since starting Pristiq seems most likely however given absence of CT findings or physical findings. -Trial lorazepam 1 mg PO now -Trial CPAP overnight -Trial of therapeutic/diagnostic albuterol once   2. Chest pain:  The patient's current pain is not located on the right near where her rib fractures are.  She also clearly  -Repeat troponin once for delta, if negative, stop -Defer echocardiogram unless symptoms persist tomorrow despite lorazepam, CPAP and albuterol trials  3. Hypotension:  Likely measurement error, given narrow pulse pressures.  Pts baseline is [id number] systolic anyway.  Perhaps mild hypovolemia.  No evidence or  suspicion of infection, PE ruled out. -Check orthostatics  4. Hypothyroidism:  -Continue levothyroxine -Check TSH  5. MCI:  -Continue memantine  6. Anxiety, chronic cystitis, migraines, IBS, ADD:  -Continue desvenlafaxine, sertraline, hydroxyzine -Lorazepam once as above  7. Chronic normocytic anemia:  Mild, stable.   8. Lung nodule: "repeat chest CT in the next 3-6 months or PET-CT is suggested"      DVT prophylaxis: Lovenox  Code Status: FULL  Family Communication: Daughter and son in law at bedside  Disposition Plan: Anticipate observation overnight.  If testing positive, dispo per results.  If testing negative, outpatient plan will depend on what helped  most, albuterol, lorazepam or CPAP. Consults called: None  Admission status: I recommend admission to med surg, observation status.  Clinical condition: stable.   Patient seen at 10:00 PM on 07/22/2015.   Edwin Dada [facility] Pager [phone]

## 2015-07-22 NOTE — ED Notes (Signed)
Pt states she "feels like she's having a panic attack" and has generally.felt SOB all week.  Pt diffuses essential oils for relaxation but with no relief this week.

## 2015-07-22 NOTE — ED Notes (Addendum)
Pt started on new medication, Pristiq, about 3 weeks ago.

## 2015-07-22 NOTE — ED Provider Notes (Signed)
CSN: [id number]     Arrival date & time 07/22/15  1315 History  By signing my name below, I, Altamease Oiler, attest that this documentation has been prepared under the direction and in the presence of Harvel Quale, MD. Electronically Signed: Altamease Oiler, ED Scribe. 07/22/2015. 7:09 PM   Chief Complaint  Patient presents with  . Shortness of Breath    The history is provided by the patient and a relative. No language interpreter was used.   Latima A Boehler is a 70 y.o. female with history of HLD, hypothyroidism, and panic attacks who presents to the Emergency Department complaining of ongoing SOB with onset 1 week ago. She has had similar difficulty breathing in the past with panic attacks but they did not last this long. Relaxation and diffusing essential oils have provided insufficient relief at home.  Associated symptoms include left lower chest pain with deep breathing. Pt denies LE swelling.  No known history of DVT/PE,  CAD or HTN. Her blood pressure typically runs low, near 90/70.   Past Medical History  Diagnosis Date  . Attention deficit disorder with hyperactivity(314.01)   . Other chronic cystitis   . Posttraumatic stress disorder     followed by Dr Lovena Le, psychiatry  . Depressive disorder, not elsewhere classified   . Esophageal reflux   . Stricture and stenosis of esophagus   . Other and unspecified hyperlipidemia   . Unspecified hypothyroidism   . Insomnia   . Osteoarthritis   . Migraine   . Obstructive sleep apnea   . Temporomandibular joint disorders, unspecified   . Thoracic spondylosis without myelopathy   . Panic attacks   . Osteoporosis   . HLD (hyperlipidemia)   . Dysrhythmia   . Kidney stones   . Complication of anesthesia     shaking after sugery in the 1980's   Past Surgical History  Procedure Laterality Date  . Appendectomy    . Cholecystectomy    . Tubal ligation    . Bladder surgery    . Eye surgery    . Abdominal hysterectomy    .  Tonsillectomy    . Colonoscopy    . Lumbar laminectomy/decompression microdiscectomy Right 01/28/2013    Procedure: LUMBAR LAMINECTOMY/DECOMPRESSION MICRODISCECTOMY LUMBAR FOUR-FIVE;  Surgeon: Faythe Ghee, MD;  Location: [facility];  Service: Neurosurgery;  Laterality: Right;  . Bladder tack    . Transforaminal lumbar interbody fusion (tlif) with pedicle screw fixation 1 level Right 11/25/2013    Procedure: TRANSFORAMINAL LUMBAR INTERBODY FUSION (TLIF) WITH PEDICLE SCREW FIXATION 1 LEVEL;  Surgeon: Faythe Ghee, MD;  Location: [facility];  Service: Neurosurgery;  Laterality: Right;  TRANSFORAMINAL LUMBAR INTERBODY FUSION (TLIF) WITH PEDICLE SCREW FIXATION 1 LEVEL LUMBAR 4-5   Family History  Problem Relation Age of Onset  . Heart attack Father   . Heart failure Father   . Liver disease Father   . Heart disease Father   . COPD Mother   . Depression Mother   . Diabetes Mother   . Crohn's disease Son    Social History  Substance Use Topics  . Smoking status: Former Smoker -- 0.50 packs/day for 20 years    Types: Cigarettes  . Smokeless tobacco: Never Used  . Alcohol Use: No   OB History    No data available     Review of Systems  Respiratory: Positive for shortness of breath.   Cardiovascular: Positive for chest pain. Negative for leg swelling.  All other  systems reviewed and are negative.  Allergies  Tetracycline and Lactose intolerance (gi)  Home Medications   Prior to Admission medications   Medication Sig Start Date End Date Taking? Authorizing Provider  aspirin 81 MG tablet Take 81 mg by mouth daily.   Yes Historical Provider, MD  desvenlafaxine (PRISTIQ) 50 MG 24 hr tablet Take 50 mg by mouth daily.   Yes Historical Provider, MD  hydrOXYzine (ATARAX/VISTARIL) 10 MG tablet Take 10 mg by mouth every 4 (four) hours as needed for itching or anxiety.  02/27/11  Yes Historical Provider, MD  levothyroxine (SYNTHROID, LEVOTHROID) 100 MCG tablet Take 50 mcg by mouth  daily.    Yes Historical Provider, MD  memantine (NAMENDA) 10 MG tablet Take 10 mg by mouth daily. Take 1/2 a day for a week, then 1 a day 08/26/13  Yes Historical Provider, MD  Omeprazole (RA OMEPRAZOLE) 20 MG TBEC Take 20 mg by mouth daily. Frequency:   Dosage:0   MG  Instructions:  Note:TAKE 1 A DAY AS NEEDED 04/12/10  Yes Historical Provider, MD  sertraline (ZOLOFT) 100 MG tablet Take 100 mg by mouth daily.   Yes Historical Provider, MD  Travoprost, BAK Free, (TRAVATAN) 0.004 % SOLN ophthalmic solution Place 1 drop into both eyes at bedtime.   Yes Historical Provider, MD  traZODone (DESYREL) 100 MG tablet Take 100 mg by mouth at bedtime.   Yes Historical Provider, MD  Vitamin D, Ergocalciferol, (DRISDOL) 50000 UNITS CAPS capsule Take 50,000 Units by mouth every 7 (seven) days. saturdays   Yes Historical Provider, MD  diphenhydrAMINE (BENADRYL) 25 MG tablet Take 1 tablet (25 mg total) by mouth every 6 (six) hours as needed. For 3 days 12/12/13   Ernestina Patches, MD  DULoxetine (CYMBALTA) 60 MG capsule Take 60 mg by mouth daily.    Historical Provider, MD  famotidine (PEPCID) 20 MG tablet Take 1 tablet (20 mg total) by mouth 2 (two) times daily. For 3 days 12/12/13   Ernestina Patches, MD  oxyCODONE-acetaminophen (PERCOCET/ROXICET) 5-325 MG per tablet Take 1-2 tablets by mouth every 4 (four) hours as needed for moderate pain. 11/26/13   Karie Chimera, MD  pravastatin (PRAVACHOL) 20 MG tablet Take 20 mg by mouth daily.  08/31/13 08/31/14  Historical Provider, MD  predniSONE (DELTASONE) 10 MG tablet Take 2 tablets (20 mg total) by mouth daily. For 3 days 12/12/13   Ernestina Patches, MD   BP 93/78 mmHg  Pulse 69  Temp(Src) 97.8 F (36.6 C) (Oral)  Resp 14  Ht 5\' 1"  (1.549 m)  Wt 147 lb (66.679 kg)  BMI 27.79 kg/m2  SpO2 100% Physical Exam  Constitutional: She is oriented to person, place, and time. She appears well-developed and well-nourished. No distress.  Ill appearing   HENT:  Head: Normocephalic and  atraumatic.  Eyes: EOM are normal.  Neck: Normal range of motion.  Cardiovascular: Normal rate, regular rhythm and normal heart sounds.   2+ bilateral radial pulses   Pulmonary/Chest: Effort normal and breath sounds normal. Tachypnea (mild) noted. She has no wheezes. She has no rales.  CTAB   Abdominal: Soft. She exhibits no distension. There is no tenderness.  Musculoskeletal: Normal range of motion. She exhibits edema.  Trace edema BLEs   Neurological: She is alert and oriented to person, place, and time.  Skin: Skin is warm and dry.  Psychiatric: She has a normal mood and affect. Judgment normal.  Nursing note and vitals reviewed.   ED Course  Procedures (including critical  care time) DIAGNOSTIC STUDIES: Oxygen Saturation is 100% on RA,  normal by my interpretation.    COORDINATION OF CARE: 3:13 PM Discussed treatment plan which includes lab work, CXR, EKG with pt at bedside and pt agreed to plan.  .bnmmtr  Labs Review Labs Reviewed  CBC WITH DIFFERENTIAL/PLATELET - Abnormal; Notable for the following:    Hemoglobin 11.2 (*)    HCT 33.2 (*)    All other components within normal limits  COMPREHENSIVE METABOLIC PANEL - Abnormal; Notable for the following:    Glucose, Bld 104 (*)    Creatinine, Ser 1.10 (*)    GFR calc non Af Amer 49 (*)    GFR calc Af Amer 57 (*)    All other components within normal limits  BRAIN NATRIURETIC PEPTIDE - Abnormal; Notable for the following:    B Natriuretic Peptide 109.3 (*)    All other components within normal limits  TROPONIN I    Imaging Review Dg Chest 2 View  07/22/2015  CLINICAL DATA:  Shortness of breath EXAM: CHEST  2 VIEW COMPARISON:  06/23/2015 chest radiograph. FINDINGS: Stable cardiomediastinal silhouette with normal heart size. No pneumothorax. No pleural effusion. Lungs appear clear, with no acute consolidative airspace disease and no pulmonary edema. IMPRESSION: No active cardiopulmonary disease. Electronically Signed    By: Ilona Sorrel M.D.   On: 07/22/2015 16:03   Ct Angio Chest Pe W/cm &/or Wo Cm  07/22/2015  CLINICAL DATA:  70 year old female with complaint of on going shortness of breath for 1 week. Left lower chest pain with deep breathing. EXAM: CT ANGIOGRAPHY CHEST WITH CONTRAST TECHNIQUE: Multidetector CT imaging of the chest was performed using the standard protocol during bolus administration of intravenous contrast. Multiplanar CT image reconstructions and MIPs were obtained to evaluate the vascular anatomy. CONTRAST:  100 mL of Isovue 370. COMPARISON:  No priors. FINDINGS: Mediastinum/Lymph Nodes: No filling defects within the pulmonary arterial tree to suggest underlying pulmonary embolism. Heart size is normal. There is no significant pericardial fluid, thickening or pericardial calcification. There is atherosclerosis of the thoracic aorta, the great vessels of the mediastinum and the coronary arteries, including calcified atherosclerotic plaque in the left anterior descending coronary artery. Esophagus is unremarkable in appearance. No axillary lymphadenopathy. Lungs/Pleura: Well-circumscribed lenticular shaped pleural-based lesion in the posterior aspect of the right hemithorax (image 42 of series 4) measuring 2.4 x 1.2 cm, favored to represent a benign lesion such as a solitary fibrous tumor of the pleura. No other suspicious appearing pulmonary nodules or masses. No acute consolidative airspace disease. No pleural effusions. Upper Abdomen: Status post cholecystectomy. Musculoskeletal/Soft Tissues: Nondisplaced fractures are noted in the anterior aspect of the right third, fourth and fifth ribs. There are no aggressive appearing lytic or blastic lesions noted in the visualized portions of the skeleton. Review of the MIP images confirms the above findings. IMPRESSION: 1. No pulmonary embolism. 2. Nondisplaced fractures of the anterior aspect of the right third, fourth and fifth ribs. There is minimal callus  associated with these fractures suggesting relatively recent injuries within the last month or so. 3. Lenticular shaped pleural-based 2.4 x 1.2 cm lesion in the posterior aspect of the right hemithorax, favored to represent a benign lesion such as a solitary fibrous tumor of the pleura. However, further evaluation with repeat chest CT in the next 3-6 months or PET-CT is suggested to ensure the benign nature of this finding. 4. Atherosclerosis, including left anterior descending coronary artery disease. Assessment for potential risk factor modification,  dietary therapy or pharmacologic therapy may be warranted, if clinically indicated. Electronically Signed   By: Vinnie Langton M.D.   On: 07/22/2015 17:14   I have personally reviewed and evaluated these images and lab results as part of my medical decision-making.  6:04 PM-Consult complete with Dr.Myers (Hospitalist). Patient case explained and discussed. Agrees to admit patient for further evaluation and treatment. Call ended at 6:07 PM   EKG Interpretation   Date/Time:  Saturday July 22 2015 13:51:46 EDT Ventricular Rate:  75 PR Interval:  120 QRS Duration: 70 QT Interval:  400 QTC Calculation: 446 R Axis:   8 Text Interpretation:  Normal sinus rhythm Nonspecific T wave abnormality  Abnormal ECG No significant change since last tracing Confirmed by NGUYEN,  EMILY ([id number]) on 07/22/2015 3:06:14 PM      MDM  Patient was seen and evaluated at bedside. My arrival to her room for evaluation the patient's blood pressure was reading as well as the 50 systolic. Blood pressure improved with IV fluid hydration. I retook the blood pressure bilaterally abdomen remained low. Patient was complaining of pleuritic like left chest pain. EKG was relatively unremarkable for patient. Laboratory studies revealed a normal troponin and were otherwise unremarkable. Chest x-ray without acute finding. CTA was obtained of the chest to evaluate for PE which was  negative. Patient was noted to have right-sided rib fractures that coincided with a history of a fall and rib bruising about a month ago. Results do not seem to explain the patient's hypotension and her feeling of chest pain and dyspnea. For this reason the patient's case was discussed with the hospitalist who agreed with admission. Patient was admitted to telemetry for chest pain rule out and further evaluation of her dyspnea. Patient and daughter at bedside were updated on results and plan of care. Final diagnoses:  Dyspnea  Chest pain, unspecified chest pain type   1. Dyspnea  2. Chest pain  I personally performed the services described in this documentation, which was scribed in my presence. The recorded information has been reviewed and is accurate.   Harvel Quale, MD 07/22/15 [phone]

## 2015-07-23 DIAGNOSIS — R0600 Dyspnea, unspecified: Secondary | ICD-10-CM

## 2015-07-23 LAB — CBC
HCT: 30.3 % — ABNORMAL LOW (ref 36.0–46.0)
Hemoglobin: 9.9 g/dL — ABNORMAL LOW (ref 12.0–15.0)
MCH: 27.3 pg (ref 26.0–34.0)
MCHC: 32.7 g/dL (ref 30.0–36.0)
MCV: 83.5 fL (ref 78.0–100.0)
Platelets: 234 10*3/uL (ref 150–400)
RBC: 3.63 MIL/uL — ABNORMAL LOW (ref 3.87–5.11)
RDW: 14.4 % (ref 11.5–15.5)
WBC: 7.8 10*3/uL (ref 4.0–10.5)

## 2015-07-23 LAB — BASIC METABOLIC PANEL
Anion gap: 8 (ref 5–15)
BUN: 15 mg/dL (ref 6–20)
CO2: 24 mmol/L (ref 22–32)
Calcium: 8.2 mg/dL — ABNORMAL LOW (ref 8.9–10.3)
Chloride: 109 mmol/L (ref 101–111)
Creatinine, Ser: 1.1 mg/dL — ABNORMAL HIGH (ref 0.44–1.00)
GFR calc Af Amer: 57 mL/min — ABNORMAL LOW (ref >60)
GFR calc non Af Amer: 49 mL/min — ABNORMAL LOW (ref >60)
Glucose, Bld: 130 mg/dL — ABNORMAL HIGH (ref 65–99)
Potassium: 3.8 mmol/L (ref 3.5–5.1)
Sodium: 141 mmol/L (ref 135–145)

## 2015-07-23 LAB — TSH: TSH: 11.692 u[IU]/mL — ABNORMAL HIGH (ref 0.350–4.500)

## 2015-07-23 MED ORDER — LEVOTHYROXINE SODIUM 75 MCG PO TABS: 75.0000 ug | ORAL_TABLET | Freq: Every day | ORAL | Status: AC

## 2015-07-23 MED ORDER — ENSURE ENLIVE PO LIQD
237.0000 mL | Freq: Two times a day (BID) | ORAL | Status: DC
  Administered 2015-07-23: 237 mL via ORAL

## 2015-07-23 NOTE — Discharge Summary (Addendum)
Brianna [name], is a 70 y.o. female  DOB 1945/04/26  MRN [id number].  Admission date:  07/22/2015  Admitting Physician  Theodis Blaze, MD  Discharge Date:  07/23/2015   Primary MD  Robyne Peers., MD  Recommendations for primary care physician for things to follow:   Patient needs a repeat CT chest in 3 months. Review CT scan report below, recommend one-time outpatient pulmonary follow-up in 1-2 months. Nonspecific CT scan findings.  Outpatient psych follow-up. This admission was most likely due to his ITP.   Admission Diagnosis  Dyspnea [R06.00] Chest pain, unspecified chest pain type [R07.9]   Discharge Diagnosis  Dyspnea [R06.00] Chest pain, unspecified chest pain type [R07.9]    Principal Problem:   Dyspnea Active Problems:   Hypothyroidism   Anxiety   CKD (chronic kidney disease), stage III   Normocytic anemia   Chest pain   Hypotension      Past Medical History  Diagnosis Date  . Attention deficit disorder with hyperactivity(314.01)   . Other chronic cystitis   . Posttraumatic stress disorder     followed by Dr Lovena Le, psychiatry  . Depressive disorder, not elsewhere classified   . Esophageal reflux   . Stricture and stenosis of esophagus   . Other and unspecified hyperlipidemia   . Unspecified hypothyroidism   . Insomnia   . Osteoarthritis   . Migraine   . Obstructive sleep apnea   . Temporomandibular joint disorders, unspecified   . Thoracic spondylosis without myelopathy   . Panic attacks   . Osteoporosis   . HLD (hyperlipidemia)   . Dysrhythmia   . Kidney stones   . Complication of anesthesia     shaking after sugery in the 1980's    Past Surgical History  Procedure Laterality Date  . Appendectomy    . Cholecystectomy    . Tubal  ligation    . Bladder surgery    . Eye surgery    . Abdominal hysterectomy    . Tonsillectomy    . Colonoscopy    . Lumbar laminectomy/decompression microdiscectomy Right 01/28/2013    Procedure: LUMBAR LAMINECTOMY/DECOMPRESSION MICRODISCECTOMY LUMBAR FOUR-FIVE;  Surgeon: Faythe Ghee, MD;  Location: [facility];  Service: Neurosurgery;  Laterality: Right;  . Bladder tack    . Transforaminal lumbar interbody fusion (tlif) with pedicle screw fixation 1 level Right 11/25/2013    Procedure: TRANSFORAMINAL LUMBAR INTERBODY FUSION (TLIF) WITH PEDICLE SCREW FIXATION 1 LEVEL;  Surgeon: Faythe Ghee, MD;  Location: [facility];  Service: Neurosurgery;  Laterality: Right;  TRANSFORAMINAL LUMBAR INTERBODY FUSION (TLIF) WITH PEDICLE SCREW FIXATION 1 LEVEL LUMBAR 4-5       HPI  from the history and physical done on the day of admission:   Brianna [name] is a 70 y.o. female with a past medical history significant for hypothyroidism, OSA not on CPAP, depression/anxiety, IBS, chronic cystitis, migraines, and PTSD who presents with dyspnea.  History is collected from the patient and her daughter both. Evidently she was in her normal state of health until about one 2 weeks ago when she started to develop slowly, gradually worse, shortness of breath. She describes this as "inability get a breath in". There has been no fever, cough, sputum. There is been no stridor or wheezing. She has chronic dizzy spells, which are unchanged. There is a new rib pain, bilaterally under the breasts, but the patient relates his caused by her having to breathe so hard. She has today some neck discomfort, like her neck is tight. The sensation of her breathing feels similar she reports to previous panic attack.  In the ED, the patient was initially reportedly hypotensive, got 1.5 L fluids, and her blood pressure reportedly improved, although narrow pulse pressures are recorded. Na 139, K 3.5, Cr 1.1, WBC 10K, BNP 109, Troponin  negative, ECG unchanged from previous. CXR was clear and CTA showed no PE, dissection or parenchymal abnormality, other than a small pleural nodule (for which outpatient follow up is recommended). She was given hydroxyzine and TRH were asked to accept in transfer by telephone for chest pain and dyspnea.  At baseline, she is sedentary. She cannot climb stairs well, and would need a cane or the support of a family member to go out of the house.      Hospital Course:     1.Subjective shortness of breath and dyspnea. CT chest nonacute, after overnight rest and sleep and 1 dose of lorazepam completely symptom free. Exam benign. He ambulated in the hallway without any shortness of breath or discomfort. We'll request PCP to ensure quick outpatient psych follow-up, patient is already following with a psychiatrist. This was likely due to increased anxiety, patient says she has incredible stress at home due to her husband's dementia. She is already on too psych medications. Her psychiatrist has been reluctant and given her benzodiazepines.  2. Atypical chest pain. Nonacute, chest pain-free, also likely anxiety related. Completely resolved after benzodiazepine. Troponin negative.  3. Hypothyroidism. TSH was 11, have increased Synthroid dose. Repeat TSH in 4 weeks.  Lab Results  Component Value Date   TSH 11.692* 07/22/2015   4.Nonspecific CT chest finding. Repeat CT in 3 months, outpatient pulmonary follow-up in 1-2 months. Recommend PCP to kindly arrange. Review CT report below in detail.  5. Anxiety depression. Continue home medications follow with primary psychiatrist within a week.  6. Chronic anemia. Stable.  7. Mild generalized weakness. Walked with a walker without any assistance in the hallway in front of me, evaluated by PT and cleared for home discharge. Have ordered home PT for some mild deconditioning and balance issues, walker provided as well.   Follow UP  Follow-up Information      Follow up with Robyne Peers., MD. Schedule an appointment as soon as possible for a visit in 3 days.   Specialty:  Family Medicine   Why:  and your psychiatrist   Contact information:   [address] [place] [address] [phone]  Consults obtained - None  Discharge Condition: Stable  Diet and Activity recommendation: See Discharge Instructions below  Discharge Instructions           Discharge Instructions    Diet - low sodium heart healthy    Complete by:  As directed      Discharge instructions    Complete by:  As directed   Follow with Primary MD Robyne Peers., MD and your psychiatrist within 3-5 days   Get CBC, CMP, 2 view Chest X ray checked  by Primary MD next visit.    Activity: As tolerated with Full fall precautions use walker/cane & assistance as needed   Disposition Home     Diet:   Heart Healthy    For Heart failure patients - Check your Weight same time everyday, if you gain over 2 pounds, or you develop in leg swelling, experience more shortness of breath or chest pain, call your Primary MD immediately. Follow Cardiac Low Salt Diet and 1.5 lit/day fluid restriction.   On your next visit with your primary care physician please Get Medicines reviewed and adjusted.   Please request your Prim.MD to go over all Hospital Tests and Procedure/Radiological results at the follow up, please get all Hospital records sent to your Prim MD by signing hospital release before you go home.   If you experience worsening of your admission symptoms, develop shortness of breath, life threatening emergency, suicidal or homicidal thoughts you must seek medical attention immediately by calling 911 or calling your MD immediately  if symptoms less severe.  You Must read complete instructions/literature along with all the possible adverse reactions/side effects for all the Medicines you take and that have been prescribed to you. Take any new Medicines  after you have completely understood and accpet all the possible adverse reactions/side effects.   Do not drive, operating heavy machinery, perform activities at heights, swimming or participation in water activities or provide baby sitting services if your were admitted for syncope or siezures until you have seen by Primary MD or a Neurologist and advised to do so again.  Do not drive when taking Pain medications.    Do not take more than prescribed Pain, Sleep and Anxiety Medications  Special Instructions: If you have smoked or chewed Tobacco  in the last 2 yrs please stop smoking, stop any regular Alcohol  and or any Recreational drug use.  Wear Seat belts while driving.   Please note  You were cared for by a hospitalist during your hospital stay. If you have any questions about your discharge medications or the care you received while you were in the hospital after you are discharged, you can call the unit and asked to speak with the hospitalist on call if the hospitalist that took care of you is not available. Once you are discharged, your primary care physician will handle any further medical issues. Please note that NO REFILLS for any discharge medications will be authorized once you are discharged, as it is imperative that you return to your primary care physician (or establish a relationship with a primary care physician if you do not have one) for your aftercare needs so that they can reassess your need for medications and monitor your lab values.     Increase activity slowly    Complete by:  As directed              Discharge Medications       Medication List    TAKE  these medications        aspirin 81 MG tablet  Take 81 mg by mouth daily.     desvenlafaxine 50 MG 24 hr tablet  Commonly known as:  PRISTIQ  Take 50 mg by mouth daily.     hydrOXYzine 10 MG tablet  Commonly known as:  ATARAX/VISTARIL  Take 10 mg by mouth every 4 (four) hours as needed for itching or  anxiety.     levothyroxine 75 MCG tablet  Commonly known as:  SYNTHROID, LEVOTHROID  Take 1 tablet (75 mcg total) by mouth daily.     memantine 10 MG tablet  Commonly known as:  NAMENDA  Take 10 mg by mouth daily. Take 1/2 a day for a week, then 1 a day     pravastatin 20 MG tablet  Commonly known as:  PRAVACHOL  Take 20 mg by mouth daily.     RA OMEPRAZOLE 20 MG Tbec  Generic drug:  Omeprazole  Take 20 mg by mouth daily. Frequency:   Dosage:0   MG  Instructions:  Note:TAKE 1 A DAY AS NEEDED     sertraline 100 MG tablet  Commonly known as:  ZOLOFT  Take 100 mg by mouth daily.     Travoprost (BAK Free) 0.004 % Soln ophthalmic solution  Commonly known as:  TRAVATAN  Place 1 drop into both eyes at bedtime.     traZODone 100 MG tablet  Commonly known as:  DESYREL  Take 100 mg by mouth at bedtime.     Vitamin D (Ergocalciferol) 50000 units Caps capsule  Commonly known as:  DRISDOL  Take 50,000 Units by mouth every 7 (seven) days. saturdays        Major procedures and Radiology Reports - PLEASE review detailed and final reports for all details, in brief -       Dg Chest 2 View  07/22/2015  CLINICAL DATA:  Shortness of breath EXAM: CHEST  2 VIEW COMPARISON:  06/23/2015 chest radiograph. FINDINGS: Stable cardiomediastinal silhouette with normal heart size. No pneumothorax. No pleural effusion. Lungs appear clear, with no acute consolidative airspace disease and no pulmonary edema. IMPRESSION: No active cardiopulmonary disease. Electronically Signed   By: Ilona Sorrel M.D.   On: 07/22/2015 16:03   Ct Angio Chest Pe W/cm &/or Wo Cm  07/22/2015  CLINICAL DATA:  70 year old female with complaint of on going shortness of breath for 1 week. Left lower chest pain with deep breathing. EXAM: CT ANGIOGRAPHY CHEST WITH CONTRAST TECHNIQUE: Multidetector CT imaging of the chest was performed using the standard protocol during bolus administration of intravenous contrast. Multiplanar CT image  reconstructions and MIPs were obtained to evaluate the vascular anatomy. CONTRAST:  100 mL of Isovue 370. COMPARISON:  No priors. FINDINGS: Mediastinum/Lymph Nodes: No filling defects within the pulmonary arterial tree to suggest underlying pulmonary embolism. Heart size is normal. There is no significant pericardial fluid, thickening or pericardial calcification. There is atherosclerosis of the thoracic aorta, the great vessels of the mediastinum and the coronary arteries, including calcified atherosclerotic plaque in the left anterior descending coronary artery. Esophagus is unremarkable in appearance. No axillary lymphadenopathy. Lungs/Pleura: Well-circumscribed lenticular shaped pleural-based lesion in the posterior aspect of the right hemithorax (image 42 of series 4) measuring 2.4 x 1.2 cm, favored to represent a benign lesion such as a solitary fibrous tumor of the pleura. No other suspicious appearing pulmonary nodules or masses. No acute consolidative airspace disease. No pleural effusions. Upper Abdomen: Status post cholecystectomy. Musculoskeletal/Soft Tissues:  Nondisplaced fractures are noted in the anterior aspect of the right third, fourth and fifth ribs. There are no aggressive appearing lytic or blastic lesions noted in the visualized portions of the skeleton. Review of the MIP images confirms the above findings. IMPRESSION: 1. No pulmonary embolism. 2. Nondisplaced fractures of the anterior aspect of the right third, fourth and fifth ribs. There is minimal callus associated with these fractures suggesting relatively recent injuries within the last month or so. 3. Lenticular shaped pleural-based 2.4 x 1.2 cm lesion in the posterior aspect of the right hemithorax, favored to represent a benign lesion such as a solitary fibrous tumor of the pleura. However, further evaluation with repeat chest CT in the next 3-6 months or PET-CT is suggested to ensure the benign nature of this finding. 4.  Atherosclerosis, including left anterior descending coronary artery disease. Assessment for potential risk factor modification, dietary therapy or pharmacologic therapy may be warranted, if clinically indicated. Electronically Signed   By: Vinnie Langton M.D.   On: 07/22/2015 17:14    Micro Results      No results found for this or any previous visit (from the past 240 hour(s)).     Today   Subjective    Brianna [name] today has no headache,no chest abdominal pain,no new weakness tingling or numbness, feels much better wants to go home today.     Objective   Blood pressure 105/47, pulse 83, temperature 98.2 F (36.8 C), temperature source Oral, resp. rate 18, height 5\' 1"  (1.549 m), weight 68.72 kg (151 lb 8 oz), SpO2 99 %.   Intake/Output Summary (Last 24 hours) at 07/23/15 1205 Last data filed at 07/23/15 1114  Gross per 24 hour  Intake    535 ml  Output      0 ml  Net    535 ml    Exam Awake Alert, Oriented x 3, No new F.N deficits, Normal affect [place].AT,PERRAL Supple Neck,No JVD, No cervical lymphadenopathy appriciated.  Symmetrical Chest wall movement, Good air movement bilaterally, CTAB RRR,No Gallops,Rubs or new Murmurs, No Parasternal Heave +ve B.Sounds, Abd Soft, Non tender, No organomegaly appriciated, No rebound -guarding or rigidity. No Cyanosis, Clubbing or edema, No new Rash or bruise   Data Review   CBC w Diff:  Lab Results  Component Value Date   WBC 7.8 07/23/2015   HGB 9.9* 07/23/2015   HCT 30.3* 07/23/2015   PLT 234 07/23/2015   LYMPHOPCT 34 07/22/2015   MONOPCT 6 07/22/2015   EOSPCT 1 07/22/2015   BASOPCT 0 07/22/2015    CMP:  Lab Results  Component Value Date   NA 141 07/23/2015   K 3.8 07/23/2015   CL 109 07/23/2015   CO2 24 07/23/2015   BUN 15 07/23/2015   CREATININE 1.10* 07/23/2015   PROT 6.7 07/22/2015   ALBUMIN 3.6 07/22/2015   BILITOT 0.5 07/22/2015   ALKPHOS 121 07/22/2015   AST 29 07/22/2015   ALT 26 07/22/2015   .   Total Time in preparing paper work, data evaluation and todays exam - 35 minutes  Thurnell Lose M.D on 07/23/2015 at 12:05 PM  [facility]   Office  [phone]

## 2015-07-23 NOTE — Care Management Note (Signed)
Case Management Note  Patient Details  Name: Brianna [name] MRN: [id number] Date of Birth: 09-29-1945  Subjective/Objective:                  Dyspnea Action/Plan: Discharge planning Expected Discharge Date:  07/23/15               Expected Discharge Plan:  [place]  In-House Referral:     Discharge planning Services  CM Consult  Post Acute Care Choice:  Home Health Choice offered to:  Patient  DME Arranged:    DME Agency:  NA  HH Arranged:  RN, PT, Nurse's Aide, Social Work [employer] Agency:  [place]  Status of Service:  Completed, signed off  Medicare Important Message Given:    Date Medicare IM Given:    Medicare IM give by:    Date Additional Medicare IM Given:    Additional Medicare Important Message give by:     If discussed at [place] of Stay Meetings, dates discussed:    Additional Comments: CM met with pt in room to offer choice of home health agency.  Pt chooses [facility] to render HHPT/RN/Aide/SW.  Referral called to [facility] rep, Santiago Glad.  Pt declines need for rolling walker. Daughter will transport pt home.  No other CM needs were communicated. Dellie Catholic, RN 07/23/2015, 12:46 PM

## 2015-07-23 NOTE — Progress Notes (Signed)
Patient discharge teaching given, including activity, diet, follow-up appoints, and medications. Patient verbalized understanding of all discharge instructions. IV access was d/c'd. Vitals are stable. Skin is intact except as charted in most recent assessments. Pt to be escorted out by NT, to be driven home by family.  Brianna [name], MBA, BSN, RN 

## 2015-07-23 NOTE — Progress Notes (Signed)
New Admission Note:   Arrival Method: Via stretcher from [facility] Mental Orientation:  A & O x 4 Telemetry: N/A Assessment: Completed Skin:  Dry but intact IV:  RUA NSL Pain: Denies Tubes: Safety Measures: Safety Fall Prevention Plan has been given, discussed and signed Admission: Completed [place] Orientation: Patient has been orientated to the room, unit and staff.   Family:  From home with husband who has the early dementia.  Her son lives with his parents to take care of them but is currently out of town.  Her daughter is taking care of her now.  She has been having significant weakness to bilateral legs and multiple falls.  They are building a new bathtub that opens up and she does not have to climb in.  She has lost 2-5 lbs over three months secondary to poor appetite.  Her daughter feels like she needs a PT consult.  Orders have been reviewed and implemented. Will continue to monitor the patient. Call light has been placed within reach and bed alarm has been activated.   Earleen Reaper RN- [place], [place] Phone number: [phone]

## 2015-07-23 NOTE — Discharge Instructions (Signed)
Follow with Primary MD Robyne Peers., MD and your psychiatrist within 3-5 days   Get CBC, CMP, 2 view Chest X ray checked  by Primary MD next visit.    Activity: As tolerated with Full fall precautions use walker/cane & assistance as needed   Disposition Home     Diet:   Heart Healthy    For Heart failure patients - Check your Weight same time everyday, if you gain over 2 pounds, or you develop in leg swelling, experience more shortness of breath or chest pain, call your Primary MD immediately. Follow Cardiac Low Salt Diet and 1.5 lit/day fluid restriction.   On your next visit with your primary care physician please Get Medicines reviewed and adjusted.   Please request your Prim.MD to go over all Hospital Tests and Procedure/Radiological results at the follow up, please get all Hospital records sent to your Prim MD by signing hospital release before you go home.   If you experience worsening of your admission symptoms, develop shortness of breath, life threatening emergency, suicidal or homicidal thoughts you must seek medical attention immediately by calling 911 or calling your MD immediately  if symptoms less severe.  You Must read complete instructions/literature along with all the possible adverse reactions/side effects for all the Medicines you take and that have been prescribed to you. Take any new Medicines after you have completely understood and accpet all the possible adverse reactions/side effects.   Do not drive, operating heavy machinery, perform activities at heights, swimming or participation in water activities or provide baby sitting services if your were admitted for syncope or siezures until you have seen by Primary MD or a Neurologist and advised to do so again.  Do not drive when taking Pain medications.    Do not take more than prescribed Pain, Sleep and Anxiety Medications  Special Instructions: If you have smoked or chewed Tobacco  in the last 2 yrs  please stop smoking, stop any regular Alcohol  and or any Recreational drug use.  Wear Seat belts while driving.   Please note  You were cared for by a hospitalist during your hospital stay. If you have any questions about your discharge medications or the care you received while you were in the hospital after you are discharged, you can call the unit and asked to speak with the hospitalist on call if the hospitalist that took care of you is not available. Once you are discharged, your primary care physician will handle any further medical issues. Please note that NO REFILLS for any discharge medications will be authorized once you are discharged, as it is imperative that you return to your primary care physician (or establish a relationship with a primary care physician if you do not have one) for your aftercare needs so that they can reassess your need for medications and monitor your lab values.

## 2015-07-23 NOTE — Evaluation (Signed)
Physical Therapy Evaluation Patient Details Name: Brianna [name] MRN: [id number] DOB: Jun 26, 1945 Today's Date: 07/23/2015   History of Present Illness  Patient is a 70 yo female admitted 07/22/15 with dyspnea.    PMH:  hypothyroidism, OSA not on CPAP, depression/anxiety, IBS, chronic cystitis, migraines, and PTSD   Clinical Impression  Patient presents with problems listed below.  Will benefit from acute PT to maximize functional mobility prior to discharge.  Patient with significant balance impairment.  Recommend HHPT for continued therapy for balance/mobility at discharge.    Follow Up Recommendations Home health PT;Supervision/Assistance - 24 hour    Equipment Recommendations  None recommended by PT    Recommendations for Other Services       Precautions / Restrictions Precautions Precautions: Fall Precaution Comments: Falls at home Restrictions Weight Bearing Restrictions: No      Mobility  Bed Mobility Overal bed mobility: Modified Independent             General bed mobility comments: Increased time  Transfers Overall transfer level: Needs assistance Equipment used: None;Rolling walker (2 wheeled) Transfers: Sit to/from Stand Sit to Stand: Min guard         General transfer comment: Assist for safety and balance.  Ambulation/Gait Ambulation/Gait assistance: Min assist Ambulation Distance (Feet): 60 Feet (30' no AD, 71' with RW) Assistive device: None;Rolling walker (2 wheeled) Gait Pattern/deviations: Step-through pattern;Decreased stride length;Staggering left;Staggering right Gait velocity: decreased Gait velocity interpretation: Below normal speed for age/gender General Gait Details: Ambulated with no assistive device, requiring min to mod assist due to loss of balance x3.  Required assist to prevent fall.  Verbal cues for safe use of RW.  Min to min guard assist for safety/balance with use of RW.  Stairs            Wheelchair Mobility     Modified Rankin (Stroke Patients Only)       Balance Overall balance assessment: Needs assistance;History of Falls         Standing balance support: Bilateral upper extremity supported Standing balance-Leahy Scale: Poor                               Pertinent Vitals/Pain Pain Assessment: 0-10 Pain Score: 7  Pain Location: Head Pain Descriptors / Indicators: Headache Pain Intervention(s): Limited activity within patient's tolerance;Monitored during session;Repositioned    Home Living Family/patient expects to be discharged to:: Private residence Living Arrangements: Spouse/significant other;Children Available Help at Discharge: Family;Available 24 hours/day Type of Home: House Home Access: Stairs to enter Entrance Stairs-Rails: None Entrance Stairs-Number of Steps: 1 Home Layout: One level Home Equipment: Walker - 4 wheels;Bedside commode      Prior Function Level of Independence: Needs assistance   Gait / Transfers Assistance Needed: Patient uses family member support for gait.  ADL's / Homemaking Assistance Needed: Reports she is independent with bathing and dressing.        Hand Dominance        Extremity/Trunk Assessment   Upper Extremity Assessment: Generalized weakness           Lower Extremity Assessment: Generalized weakness         Communication   Communication: No difficulties  Cognition Arousal/Alertness: Awake/alert Behavior During Therapy: WFL for tasks assessed/performed;Flat affect Overall Cognitive Status: History of cognitive impairments - at baseline  General Comments      Exercises        Assessment/Plan    PT Assessment Patient needs continued PT services  PT Diagnosis Abnormality of gait;Generalized weakness;Acute pain   PT Problem List Decreased strength;Decreased activity tolerance;Decreased balance;Decreased mobility;Decreased knowledge of use of DME;Pain  PT Treatment  Interventions DME instruction;Gait training;Functional mobility training;Therapeutic activities;Balance training;Patient/family education   PT Goals (Current goals can be found in the Care Plan section) Acute Rehab PT Goals Patient Stated Goal: To go home PT Goal Formulation: With patient Time For Goal Achievement: 07/30/15 Potential to Achieve Goals: Good    Frequency Min 3X/week   Barriers to discharge        Co-evaluation               End of Session Equipment Utilized During Treatment: Gait belt Activity Tolerance: Patient tolerated treatment well;Patient limited by pain Patient left: in bed;with call bell/phone within reach;with bed alarm set Nurse Communication: Mobility status (HHPT for d/c)    Functional Assessment Tool Used: Clinical judgement Functional Limitation: Mobility: Walking and moving around Mobility: Walking and Moving Around Current Status [id number]): At least 20 percent but less than 40 percent impaired, limited or restricted Mobility: Walking and Moving Around Goal Status [phone]): At least 20 percent but less than 40 percent impaired, limited or restricted Mobility: Walking and Moving Around Discharge Status [phone]): At least 20 percent but less than 40 percent impaired, limited or restricted    Time: 1123-1139 PT Time Calculation (min) (ACUTE ONLY): 16 min   Charges:   PT Evaluation $PT Eval Moderate Complexity: 1 Procedure     PT G Codes:   PT G-Codes **NOT FOR INPATIENT CLASS** Functional Assessment Tool Used: Clinical judgement Functional Limitation: Mobility: Walking and moving around Mobility: Walking and Moving Around Current Status [id number]): At least 20 percent but less than 40 percent impaired, limited or restricted Mobility: Walking and Moving Around Goal Status [phone]): At least 20 percent but less than 40 percent impaired, limited or restricted Mobility: Walking and Moving Around Discharge Status [phone]): At least 20 percent but less than  40 percent impaired, limited or restricted    Despina Pole 07/23/2015, 11:58 AM Carita Pian. Sanjuana Kava, [place] Pager [phone]

## 2015-07-24 DIAGNOSIS — F4310 Post-traumatic stress disorder, unspecified: Secondary | ICD-10-CM | POA: Diagnosis not present

## 2015-07-24 DIAGNOSIS — N183 Chronic kidney disease, stage 3 (moderate): Secondary | ICD-10-CM | POA: Diagnosis not present

## 2015-07-24 DIAGNOSIS — E785 Hyperlipidemia, unspecified: Secondary | ICD-10-CM | POA: Diagnosis not present

## 2015-07-24 DIAGNOSIS — E039 Hypothyroidism, unspecified: Secondary | ICD-10-CM | POA: Diagnosis not present

## 2015-07-24 DIAGNOSIS — K219 Gastro-esophageal reflux disease without esophagitis: Secondary | ICD-10-CM | POA: Diagnosis not present

## 2015-07-24 DIAGNOSIS — G4733 Obstructive sleep apnea (adult) (pediatric): Secondary | ICD-10-CM | POA: Diagnosis not present

## 2015-07-24 DIAGNOSIS — I959 Hypotension, unspecified: Secondary | ICD-10-CM | POA: Diagnosis not present

## 2015-07-24 DIAGNOSIS — F419 Anxiety disorder, unspecified: Secondary | ICD-10-CM | POA: Diagnosis not present

## 2015-07-24 DIAGNOSIS — F909 Attention-deficit hyperactivity disorder, unspecified type: Secondary | ICD-10-CM | POA: Diagnosis not present

## 2015-07-24 DIAGNOSIS — R0600 Dyspnea, unspecified: Secondary | ICD-10-CM | POA: Diagnosis not present

## 2015-07-24 DIAGNOSIS — F329 Major depressive disorder, single episode, unspecified: Secondary | ICD-10-CM | POA: Diagnosis not present

## 2015-07-31 DIAGNOSIS — F329 Major depressive disorder, single episode, unspecified: Secondary | ICD-10-CM | POA: Diagnosis not present

## 2015-07-31 DIAGNOSIS — F4310 Post-traumatic stress disorder, unspecified: Secondary | ICD-10-CM | POA: Diagnosis not present

## 2015-07-31 DIAGNOSIS — N183 Chronic kidney disease, stage 3 (moderate): Secondary | ICD-10-CM | POA: Diagnosis not present

## 2015-07-31 DIAGNOSIS — E785 Hyperlipidemia, unspecified: Secondary | ICD-10-CM | POA: Diagnosis not present

## 2015-07-31 DIAGNOSIS — F419 Anxiety disorder, unspecified: Secondary | ICD-10-CM | POA: Diagnosis not present

## 2015-07-31 DIAGNOSIS — G4733 Obstructive sleep apnea (adult) (pediatric): Secondary | ICD-10-CM | POA: Diagnosis not present

## 2015-07-31 DIAGNOSIS — R0600 Dyspnea, unspecified: Secondary | ICD-10-CM | POA: Diagnosis not present

## 2015-07-31 DIAGNOSIS — E039 Hypothyroidism, unspecified: Secondary | ICD-10-CM | POA: Diagnosis not present

## 2015-07-31 DIAGNOSIS — I959 Hypotension, unspecified: Secondary | ICD-10-CM | POA: Diagnosis not present

## 2015-07-31 DIAGNOSIS — K219 Gastro-esophageal reflux disease without esophagitis: Secondary | ICD-10-CM | POA: Diagnosis not present

## 2015-07-31 DIAGNOSIS — F909 Attention-deficit hyperactivity disorder, unspecified type: Secondary | ICD-10-CM | POA: Diagnosis not present

## 2015-08-02 DIAGNOSIS — N183 Chronic kidney disease, stage 3 (moderate): Secondary | ICD-10-CM | POA: Diagnosis not present

## 2015-08-02 DIAGNOSIS — K219 Gastro-esophageal reflux disease without esophagitis: Secondary | ICD-10-CM | POA: Diagnosis not present

## 2015-08-02 DIAGNOSIS — I959 Hypotension, unspecified: Secondary | ICD-10-CM | POA: Diagnosis not present

## 2015-08-02 DIAGNOSIS — F909 Attention-deficit hyperactivity disorder, unspecified type: Secondary | ICD-10-CM | POA: Diagnosis not present

## 2015-08-02 DIAGNOSIS — F419 Anxiety disorder, unspecified: Secondary | ICD-10-CM | POA: Diagnosis not present

## 2015-08-02 DIAGNOSIS — E039 Hypothyroidism, unspecified: Secondary | ICD-10-CM | POA: Diagnosis not present

## 2015-08-02 DIAGNOSIS — G4733 Obstructive sleep apnea (adult) (pediatric): Secondary | ICD-10-CM | POA: Diagnosis not present

## 2015-08-02 DIAGNOSIS — F4310 Post-traumatic stress disorder, unspecified: Secondary | ICD-10-CM | POA: Diagnosis not present

## 2015-08-02 DIAGNOSIS — E785 Hyperlipidemia, unspecified: Secondary | ICD-10-CM | POA: Diagnosis not present

## 2015-08-02 DIAGNOSIS — F329 Major depressive disorder, single episode, unspecified: Secondary | ICD-10-CM | POA: Diagnosis not present

## 2015-08-02 DIAGNOSIS — R0600 Dyspnea, unspecified: Secondary | ICD-10-CM | POA: Diagnosis not present

## 2015-08-03 DIAGNOSIS — D6489 Other specified anemias: Secondary | ICD-10-CM | POA: Diagnosis not present

## 2015-08-03 DIAGNOSIS — F4310 Post-traumatic stress disorder, unspecified: Secondary | ICD-10-CM | POA: Diagnosis not present

## 2015-08-03 DIAGNOSIS — F909 Attention-deficit hyperactivity disorder, unspecified type: Secondary | ICD-10-CM | POA: Diagnosis not present

## 2015-08-03 DIAGNOSIS — E784 Other hyperlipidemia: Secondary | ICD-10-CM | POA: Diagnosis not present

## 2015-08-03 DIAGNOSIS — F419 Anxiety disorder, unspecified: Secondary | ICD-10-CM | POA: Diagnosis not present

## 2015-08-03 DIAGNOSIS — E785 Hyperlipidemia, unspecified: Secondary | ICD-10-CM | POA: Diagnosis not present

## 2015-08-03 DIAGNOSIS — R918 Other nonspecific abnormal finding of lung field: Secondary | ICD-10-CM | POA: Diagnosis not present

## 2015-08-03 DIAGNOSIS — F329 Major depressive disorder, single episode, unspecified: Secondary | ICD-10-CM | POA: Diagnosis not present

## 2015-08-03 DIAGNOSIS — G4733 Obstructive sleep apnea (adult) (pediatric): Secondary | ICD-10-CM | POA: Diagnosis not present

## 2015-08-03 DIAGNOSIS — R0600 Dyspnea, unspecified: Secondary | ICD-10-CM | POA: Diagnosis not present

## 2015-08-03 DIAGNOSIS — K219 Gastro-esophageal reflux disease without esophagitis: Secondary | ICD-10-CM | POA: Diagnosis not present

## 2015-08-03 DIAGNOSIS — N289 Disorder of kidney and ureter, unspecified: Secondary | ICD-10-CM | POA: Diagnosis not present

## 2015-08-03 DIAGNOSIS — E038 Other specified hypothyroidism: Secondary | ICD-10-CM | POA: Diagnosis not present

## 2015-08-03 DIAGNOSIS — E039 Hypothyroidism, unspecified: Secondary | ICD-10-CM | POA: Diagnosis not present

## 2015-08-03 DIAGNOSIS — N183 Chronic kidney disease, stage 3 (moderate): Secondary | ICD-10-CM | POA: Diagnosis not present

## 2015-08-03 DIAGNOSIS — I959 Hypotension, unspecified: Secondary | ICD-10-CM | POA: Diagnosis not present

## 2015-08-08 DIAGNOSIS — E785 Hyperlipidemia, unspecified: Secondary | ICD-10-CM | POA: Diagnosis not present

## 2015-08-08 DIAGNOSIS — G4733 Obstructive sleep apnea (adult) (pediatric): Secondary | ICD-10-CM | POA: Diagnosis not present

## 2015-08-08 DIAGNOSIS — F329 Major depressive disorder, single episode, unspecified: Secondary | ICD-10-CM | POA: Diagnosis not present

## 2015-08-08 DIAGNOSIS — F419 Anxiety disorder, unspecified: Secondary | ICD-10-CM | POA: Diagnosis not present

## 2015-08-08 DIAGNOSIS — F909 Attention-deficit hyperactivity disorder, unspecified type: Secondary | ICD-10-CM | POA: Diagnosis not present

## 2015-08-08 DIAGNOSIS — N183 Chronic kidney disease, stage 3 (moderate): Secondary | ICD-10-CM | POA: Diagnosis not present

## 2015-08-08 DIAGNOSIS — I959 Hypotension, unspecified: Secondary | ICD-10-CM | POA: Diagnosis not present

## 2015-08-08 DIAGNOSIS — F4310 Post-traumatic stress disorder, unspecified: Secondary | ICD-10-CM | POA: Diagnosis not present

## 2015-08-08 DIAGNOSIS — K219 Gastro-esophageal reflux disease without esophagitis: Secondary | ICD-10-CM | POA: Diagnosis not present

## 2015-08-08 DIAGNOSIS — R0600 Dyspnea, unspecified: Secondary | ICD-10-CM | POA: Diagnosis not present

## 2015-08-08 DIAGNOSIS — E039 Hypothyroidism, unspecified: Secondary | ICD-10-CM | POA: Diagnosis not present

## 2015-08-09 DIAGNOSIS — K219 Gastro-esophageal reflux disease without esophagitis: Secondary | ICD-10-CM | POA: Diagnosis not present

## 2015-08-09 DIAGNOSIS — I959 Hypotension, unspecified: Secondary | ICD-10-CM | POA: Diagnosis not present

## 2015-08-09 DIAGNOSIS — F419 Anxiety disorder, unspecified: Secondary | ICD-10-CM | POA: Diagnosis not present

## 2015-08-09 DIAGNOSIS — F909 Attention-deficit hyperactivity disorder, unspecified type: Secondary | ICD-10-CM | POA: Diagnosis not present

## 2015-08-09 DIAGNOSIS — F4310 Post-traumatic stress disorder, unspecified: Secondary | ICD-10-CM | POA: Diagnosis not present

## 2015-08-09 DIAGNOSIS — E785 Hyperlipidemia, unspecified: Secondary | ICD-10-CM | POA: Diagnosis not present

## 2015-08-09 DIAGNOSIS — E039 Hypothyroidism, unspecified: Secondary | ICD-10-CM | POA: Diagnosis not present

## 2015-08-09 DIAGNOSIS — F329 Major depressive disorder, single episode, unspecified: Secondary | ICD-10-CM | POA: Diagnosis not present

## 2015-08-09 DIAGNOSIS — G4733 Obstructive sleep apnea (adult) (pediatric): Secondary | ICD-10-CM | POA: Diagnosis not present

## 2015-08-09 DIAGNOSIS — R0600 Dyspnea, unspecified: Secondary | ICD-10-CM | POA: Diagnosis not present

## 2015-08-09 DIAGNOSIS — N183 Chronic kidney disease, stage 3 (moderate): Secondary | ICD-10-CM | POA: Diagnosis not present

## 2015-08-18 ENCOUNTER — Encounter: Payer: Self-pay | Admitting: Gastroenterology

## 2015-08-18 ENCOUNTER — Ambulatory Visit (INDEPENDENT_AMBULATORY_CARE_PROVIDER_SITE_OTHER): Payer: PPO | Admitting: Gastroenterology

## 2015-08-18 VITALS — BP 128/68 | HR 72 | Ht 61.0 in | Wt 151.4 lb

## 2015-08-18 DIAGNOSIS — K589 Irritable bowel syndrome without diarrhea: Secondary | ICD-10-CM

## 2015-08-18 DIAGNOSIS — R1013 Epigastric pain: Secondary | ICD-10-CM

## 2015-08-18 DIAGNOSIS — R159 Full incontinence of feces: Secondary | ICD-10-CM

## 2015-08-18 NOTE — Progress Notes (Signed)
[facility] Consult Note:  History: Brianna [name] 08/18/2015  Referring physician: Robyne [name]., MD  Reason for consult/chief complaint: Abdominal Pain; Irritable Bowel Syndrome; and Encopresis   Subjective HPI:  This patient establish care with me after recent ED visit for chest/epigastric pain. I saw Dr. Olevia [name] in December 2012 for IBS. Upper endoscopy at that time showed 2  small NSAID- induced ulcers Brianna [name] came to the [facility] on 06/19/2015 complaining of chest and epigastric pain. She ruled out for cardiac cause, CT scan abdomen and pelvis was normal, lab workup was unrevealing. She thinks that anxiety attack may have prompted this. She was admitted briefly to [facility] last month for dyspnea. Her epigastric pain is resolved, she still is some underlying issues of abdominal bloating and alternating bowel habits. On rare occasion she has fecal incontinence if she cannot get to the bathroom on time when there is urgency. Unfortunately, that happened upon her arrival to the clinic today.  ROS:  Review of Systems She denies weight loss, she does have chronic urinary incontinence as well.  Past Medical History: Past Medical History  Diagnosis Date  . Attention deficit disorder with hyperactivity(314.01)   . Other chronic cystitis   . Posttraumatic stress disorder     followed by Dr Brianna [name], psychiatry  . Depressive disorder, not elsewhere classified   . Esophageal reflux   . Stricture and stenosis of esophagus   . Other and unspecified hyperlipidemia   . Unspecified hypothyroidism   . Insomnia   . Osteoarthritis   . Migraine   . Obstructive sleep apnea   . Temporomandibular joint disorders, unspecified   . Thoracic spondylosis without myelopathy   . Panic attacks   . Osteoporosis   . HLD (hyperlipidemia)   . Dysrhythmia   . Kidney stones   . Complication of anesthesia     shaking after sugery in the 1980's  . GERD  (gastroesophageal reflux disease)      Past Surgical History: Past Surgical History  Procedure Laterality Date  . Appendectomy    . Cholecystectomy    . Tubal ligation    . Bladder surgery    . Eye surgery    . Abdominal hysterectomy    . Tonsillectomy    . Colonoscopy    . Lumbar laminectomy/decompression microdiscectomy Right 01/28/2013    Procedure: LUMBAR LAMINECTOMY/DECOMPRESSION MICRODISCECTOMY LUMBAR FOUR-FIVE;  Surgeon: Brianna Ghee, MD;  Location: [facility];  Service: Neurosurgery;  Laterality: Right;  . Bladder tack    . Transforaminal lumbar interbody fusion (tlif) with pedicle screw fixation 1 level Right 11/25/2013    Procedure: TRANSFORAMINAL LUMBAR INTERBODY FUSION (TLIF) WITH PEDICLE SCREW FIXATION 1 LEVEL;  Surgeon: Brianna Ghee, MD;  Location: [place] [facility];  Service: Neurosurgery;  Laterality: Right;  TRANSFORAMINAL LUMBAR INTERBODY FUSION (TLIF) WITH PEDICLE SCREW FIXATION 1 LEVEL LUMBAR 4-5  . Cystorrhaphy       Family History: Family History  Problem Relation Age of Onset  . Heart attack Father   . Heart failure Father   . Liver disease Father   . Heart disease Father   . COPD Mother   . Depression Mother   . Diabetes Mother   . Crohn's disease Son     Social History: Social History   Social History  . Marital Status: Married    Spouse Name: N/A  . Number of Children: 4  . Years of Education: N/A   Occupational History  . retired  Social History Main Topics  . Smoking status: Former Smoker -- 0.50 packs/day for 20 years    Types: Cigarettes  . Smokeless tobacco: Never Used  . Alcohol Use: No  . Drug Use: No  . Sexual Activity: Not Asked   Other Topics Concern  . None   Social History Narrative   Pt drinks two cups of coffee daily    Allergies: Allergies  Allergen Reactions  . Tetracycline Swelling  . Lactose Intolerance (Gi) Nausea And Vomiting    Outpatient Meds: Current Outpatient Prescriptions  Medication Sig  Dispense Refill  . aspirin 81 MG tablet Take 81 mg by mouth daily.    Marland Kitchen desvenlafaxine (PRISTIQ) 50 MG 24 hr tablet Take 50 mg by mouth daily.    . hydrOXYzine (ATARAX/VISTARIL) 10 MG tablet Take 10 mg by mouth every 4 (four) hours as needed for itching or anxiety.     Marland Kitchen levothyroxine (SYNTHROID, LEVOTHROID) 75 MCG tablet Take 1 tablet (75 mcg total) by mouth daily. 30 tablet 0  . memantine (NAMENDA) 10 MG tablet Take 10 mg by mouth daily. Take 1/2 a day for a week, then 1 a day    . Omeprazole (RA OMEPRAZOLE) 20 MG TBEC Take 20 mg by mouth daily. Frequency:   Dosage:0   MG  Instructions:  Note:TAKE 1 A DAY AS NEEDED    . sertraline (ZOLOFT) 100 MG tablet Take 100 mg by mouth daily.    . Travoprost, BAK Free, (TRAVATAN) 0.004 % SOLN ophthalmic solution Place 1 drop into both eyes at bedtime.    . traZODone (DESYREL) 100 MG tablet Take 100 mg by mouth at bedtime.    . Vitamin D, Ergocalciferol, (DRISDOL) 50000 UNITS CAPS capsule Take 50,000 Units by mouth every 7 (seven) days. saturdays    . pravastatin (PRAVACHOL) 20 MG tablet Take 20 mg by mouth daily.      No current facility-administered medications for this visit.      ___________________________________________________________________ Objective  Exam:  BP 128/68 mmHg  Pulse 72  Ht 5\' 1"  (1.549 m)  Wt 151 lb 6.4 oz (68.675 kg)  BMI 28.62 kg/m2   General: this is a(n) Pleasant and anxious-appearing elderly woman coming by her son. She is not acutely ill and has good muscle mass  Eyes: sclera anicteric, no redness  ENT: oral mucosa moist without lesions, no cervical or supraclavicular lymphadenopathy, good dentition  CV: RRR without murmur, S1/S2, no JVD, no peripheral edema  Resp: clear to auscultation bilaterally, normal RR and effort noted  GI: soft, no tenderness, with active bowel sounds. No guarding or palpable organomegaly noted.  Skin; warm and dry, no rash or jaundice noted  Neuro: awake, alert and oriented x 3.  Normal gross motor function and fluent speech  Data  See data reviewed from the ED visit noted above Assessment: Encounter Diagnoses  Name Primary?  . Fecal incontinence Yes  . IBS (irritable bowel syndrome)   . Abdominal pain, epigastric     I do not think she has a chronic digestive issue to account for this lower chest/epigastric pain that prompted this recent ED visit. She certainly has underlying IBS  Plan:  I've made no changes in medicines, order no imaging or endoscopy at this point. She is reassured and will see me as needed.  Thank you for the courtesy of this consult.  Please call me with any questions or concerns.  Nelida Meuse III  CC: Brianna [name]., MD

## 2015-08-18 NOTE — Patient Instructions (Signed)
Follow up as needed

## 2015-08-28 DIAGNOSIS — F330 Major depressive disorder, recurrent, mild: Secondary | ICD-10-CM | POA: Diagnosis not present

## 2015-09-18 ENCOUNTER — Emergency Department (HOSPITAL_COMMUNITY): Payer: PPO

## 2015-09-18 ENCOUNTER — Encounter (HOSPITAL_COMMUNITY): Payer: Self-pay

## 2015-09-18 ENCOUNTER — Ambulatory Visit: Payer: Self-pay | Admitting: Gastroenterology

## 2015-09-18 ENCOUNTER — Emergency Department (HOSPITAL_COMMUNITY)
Admission: EM | Admit: 2015-09-18 | Discharge: 2015-09-18 | Disposition: A | Discharge status: Home / Self Care | Payer: PPO | Attending: Emergency Medicine | Admitting: Emergency Medicine

## 2015-09-18 DIAGNOSIS — F329 Major depressive disorder, single episode, unspecified: Secondary | ICD-10-CM | POA: Diagnosis not present

## 2015-09-18 DIAGNOSIS — Y92481 Parking lot as the place of occurrence of the external cause: Secondary | ICD-10-CM | POA: Diagnosis not present

## 2015-09-18 DIAGNOSIS — S098XXA Other specified injuries of head, initial encounter: Secondary | ICD-10-CM | POA: Diagnosis not present

## 2015-09-18 DIAGNOSIS — S0990XA Unspecified injury of head, initial encounter: Secondary | ICD-10-CM

## 2015-09-18 DIAGNOSIS — M1990 Unspecified osteoarthritis, unspecified site: Secondary | ICD-10-CM | POA: Diagnosis not present

## 2015-09-18 DIAGNOSIS — M810 Age-related osteoporosis without current pathological fracture: Secondary | ICD-10-CM | POA: Diagnosis not present

## 2015-09-18 DIAGNOSIS — Y9301 Activity, walking, marching and hiking: Secondary | ICD-10-CM | POA: Diagnosis not present

## 2015-09-18 DIAGNOSIS — Y999 Unspecified external cause status: Secondary | ICD-10-CM | POA: Diagnosis not present

## 2015-09-18 DIAGNOSIS — M546 Pain in thoracic spine: Secondary | ICD-10-CM | POA: Diagnosis not present

## 2015-09-18 DIAGNOSIS — E039 Hypothyroidism, unspecified: Secondary | ICD-10-CM | POA: Diagnosis not present

## 2015-09-18 DIAGNOSIS — Z87891 Personal history of nicotine dependence: Secondary | ICD-10-CM | POA: Diagnosis not present

## 2015-09-18 DIAGNOSIS — S0181XA Laceration without foreign body of other part of head, initial encounter: Secondary | ICD-10-CM | POA: Diagnosis not present

## 2015-09-18 DIAGNOSIS — W19XXXA Unspecified fall, initial encounter: Secondary | ICD-10-CM

## 2015-09-18 DIAGNOSIS — S0001XA Abrasion of scalp, initial encounter: Secondary | ICD-10-CM | POA: Diagnosis not present

## 2015-09-18 DIAGNOSIS — W228XXA Striking against or struck by other objects, initial encounter: Secondary | ICD-10-CM | POA: Diagnosis not present

## 2015-09-18 DIAGNOSIS — Z7982 Long term (current) use of aspirin: Secondary | ICD-10-CM | POA: Diagnosis not present

## 2015-09-18 HISTORY — DX: Unspecified dementia, unspecified severity, without behavioral disturbance, psychotic disturbance, mood disturbance, and anxiety: F03.90

## 2015-09-18 MED ORDER — ACETAMINOPHEN 500 MG PO TABS: 1000.0000 mg | ORAL_TABLET | Freq: Four times a day (QID) | ORAL | Status: DC | PRN

## 2015-09-18 MED ORDER — ACETAMINOPHEN 500 MG PO TABS
1000.0000 mg | ORAL_TABLET | Freq: Once | ORAL | Status: AC
  Administered 2015-09-18: 1000 mg via ORAL
  Filled 2015-09-18: qty 2

## 2015-09-18 MED ORDER — MAGNESIUM CITRATE PO SOLN: 1.0000 | Freq: Once | ORAL | Status: DC

## 2015-09-18 NOTE — ED Provider Notes (Signed)
CSN: [id number]     Arrival date & time 09/18/15  1118 History   First MD Initiated Contact with Patient 09/18/15 1216     Chief Complaint  Patient presents with  . Fall     (Consider location/radiation/quality/duration/timing/severity/associated sxs/prior Treatment) HPI Patient was on her way to a gastroenterology appointment at [facility] physicians. She was walking with her son in the parking lot and she lost her balance and fell backwards. She landed on her buttock and back and then went on to hit her head on the curb. She was not knocked out. She does report she has a posterior headache. No visual changes. No focal weakness numbness or tingling. She reports she does have some pain in her back but she was able to stand with assistance and bear weight to ambulate to a stretcher for transport. No chest pain or shortness of breath. No abdominal pain. No confusion.  As an aside, patient poor she's had constipation for several days. She reports that she has been taking Colace and a suppository as well as an asthma without bowel movement. She denies having abdominal pain at this time.   Past Medical History  Diagnosis Date  . Attention deficit disorder with hyperactivity(314.01)   . Other chronic cystitis   . Posttraumatic stress disorder     followed by Dr Lovena Le, psychiatry  . Depressive disorder, not elsewhere classified   . Esophageal reflux   . Stricture and stenosis of esophagus   . Other and unspecified hyperlipidemia   . Unspecified hypothyroidism   . Insomnia   . Osteoarthritis   . Migraine   . Obstructive sleep apnea   . Temporomandibular joint disorders, unspecified   . Thoracic spondylosis without myelopathy   . Panic attacks   . Osteoporosis   . HLD (hyperlipidemia)   . Dysrhythmia   . Kidney stones   . Complication of anesthesia     shaking after sugery in the 1980's  . GERD (gastroesophageal reflux disease)   . Dementia    Past Surgical History  Procedure  Laterality Date  . Appendectomy    . Cholecystectomy    . Tubal ligation    . Bladder surgery    . Eye surgery    . Abdominal hysterectomy    . Tonsillectomy    . Colonoscopy    . Lumbar laminectomy/decompression microdiscectomy Right 01/28/2013    Procedure: LUMBAR LAMINECTOMY/DECOMPRESSION MICRODISCECTOMY LUMBAR FOUR-FIVE;  Surgeon: Faythe Ghee, MD;  Location: [facility];  Service: Neurosurgery;  Laterality: Right;  . Bladder tack    . Transforaminal lumbar interbody fusion (tlif) with pedicle screw fixation 1 level Right 11/25/2013    Procedure: TRANSFORAMINAL LUMBAR INTERBODY FUSION (TLIF) WITH PEDICLE SCREW FIXATION 1 LEVEL;  Surgeon: Faythe Ghee, MD;  Location: [place] [facility];  Service: Neurosurgery;  Laterality: Right;  TRANSFORAMINAL LUMBAR INTERBODY FUSION (TLIF) WITH PEDICLE SCREW FIXATION 1 LEVEL LUMBAR 4-5  . Cystorrhaphy     Family History  Problem Relation Age of Onset  . Heart attack Father   . Heart failure Father   . Liver disease Father   . Heart disease Father   . COPD Mother   . Depression Mother   . Diabetes Mother   . Crohn's disease Son    Social History  Substance Use Topics  . Smoking status: Former Smoker -- 0.50 packs/day for 20 years    Types: Cigarettes  . Smokeless tobacco: Never Used  . Alcohol Use: No   OB History  No data available     Review of Systems  10 Systems reviewed and are negative for acute change except as noted in the HPI.   Allergies  Tetracycline and Lactose intolerance (gi)  Home Medications   Prior to Admission medications   Medication Sig Start Date End Date Taking? Authorizing Provider  aspirin 81 MG tablet Take 81 mg by mouth daily.   Yes Historical Provider, MD  atorvastatin (LIPITOR) 40 MG tablet Take 40 mg by mouth every evening. 08/27/15  Yes Historical Provider, MD  desvenlafaxine (PRISTIQ) 50 MG 24 hr tablet Take 50 mg by mouth daily.   Yes Historical Provider, MD  Docusate Sodium (COLACE PO) Take 6  tablets by mouth at bedtime as needed (constipation).   Yes Historical Provider, MD  hydrOXYzine (VISTARIL) 50 MG capsule Take 50-100 mg by mouth 3 (three) times daily as needed for anxiety or itching.  08/28/15  Yes Historical Provider, MD  ibuprofen (ADVIL,MOTRIN) 200 MG tablet Take 600 mg by mouth every 6 (six) hours as needed for headache or moderate pain.   Yes Historical Provider, MD  levothyroxine (SYNTHROID, LEVOTHROID) 75 MCG tablet Take 1 tablet (75 mcg total) by mouth daily. 07/23/15  Yes Thurnell Lose, MD  memantine (NAMENDA) 10 MG tablet Take 10 mg by mouth daily.  08/26/13  Yes Historical Provider, MD  Multiple Vitamins-Minerals (MULTIVITAMIN GUMMIES ADULT PO) Take 2 tablets by mouth daily.   Yes Historical Provider, MD  omeprazole (PRILOSEC) 40 MG capsule Take 40 mg by mouth daily.   Yes Historical Provider, MD  sertraline (ZOLOFT) 100 MG tablet Take 100 mg by mouth daily.   Yes Historical Provider, MD  timolol (TIMOPTIC) 0.5 % ophthalmic solution Place 1 drop into both eyes 2 (two) times daily. 09/08/15  Yes Historical Provider, MD  traZODone (DESYREL) 100 MG tablet Take 100-200 mg by mouth at bedtime as needed for sleep.    Yes Historical Provider, MD  Vitamin D, Ergocalciferol, (DRISDOL) 50000 UNITS CAPS capsule Take 50,000 Units by mouth every 7 (seven) days. saturdays   Yes Historical Provider, MD  acetaminophen (TYLENOL) 500 MG tablet Take 2 tablets (1,000 mg total) by mouth every 6 (six) hours as needed. 09/18/15   Charlesetta Shanks, MD  magnesium citrate SOLN Take 296 mLs (1 Bottle total) by mouth once. 09/18/15   Charlesetta Shanks, MD   BP 107/65 mmHg  Pulse 71  Temp(Src) 99.3 F (37.4 C) (Oral)  Resp 18  Ht 5\' 1"  (1.549 m)  Wt 145 lb (65.772 kg)  BMI 27.41 kg/m2  SpO2 97% Physical Exam  Constitutional: She is oriented to person, place, and time. She appears well-developed and well-nourished.  HENT:  Right Ear: External ear normal.  Left Ear: External ear normal.  Nose: Nose  normal.  Mouth/Throat: Oropharynx is clear and moist.  There is a 3 mm superficial abrasion to the posterior head with without any associated bleeding. No associated hematoma. This is into the superficial dermis but does not penetrate the subcutaneous tissue. No active bleeding.  Eyes: EOM are normal. Pupils are equal, round, and reactive to light.  Neck: Neck supple.  No C-spine tenderness.  Cardiovascular: Normal rate, regular rhythm, normal heart sounds and intact distal pulses.   Pulmonary/Chest: Effort normal and breath sounds normal.  Abdominal: Soft. Bowel sounds are normal. She exhibits no distension. There is no tenderness.  Musculoskeletal: Normal range of motion. She exhibits no edema.  Entire spine palpated without step-off or deformity. Patient does not endorse any  focal bony point tenderness. Patient is able to reposition from lying on her side to her back. The patient can fully flex and extend both lower extremities against resistance without difficulty. This also does not elicit pain.  Neurological: She is alert and oriented to person, place, and time. She has normal strength. No cranial nerve deficit. She exhibits normal muscle tone. Coordination normal. GCS eye subscore is 4. GCS verbal subscore is 5. GCS motor subscore is 6.  Skin: Skin is warm, dry and intact.  Psychiatric: She has a normal mood and affect.    ED Course  Procedures (including critical care time) Labs Review Labs Reviewed - No data to display  Imaging Review Ct Head Wo Contrast  09/18/2015  CLINICAL DATA:  Patient lost balance fell backwards striking back of head. EXAM: CT HEAD WITHOUT CONTRAST TECHNIQUE: Contiguous axial images were obtained from the base of the skull through the vertex without intravenous contrast. COMPARISON:  10/21/2011 FINDINGS: There is no evidence for acute hemorrhage, hydrocephalus, mass lesion, or abnormal extra-axial fluid collection. No definite CT evidence for acute infarction.  Lacunar infarcts noted in the left basal ganglia, stable in the interval. The visualized paranasal sinuses and mastoid air cells are clear. IMPRESSION: Stable.  No acute intracranial abnormality. Electronically Signed   By: Misty Stanley M.D.   On: 09/18/2015 13:18   I have personally reviewed and evaluated these images and lab results as part of my medical decision-making.   EKG Interpretation None      MDM   Final diagnoses:  Fall, initial encounter  Head injury, initial encounter   The patient has had fall with posterior head injury. CT shows no intracranial bleed. Patient's mental status is clear. Neurologic examination is intact. At this time she is a family members. She will be discharged with head injury precaution instructions. At this time, no evidence of bony fractures or other neurologic injury. Patient does incidentally report constipation. She will be instructed to try a dose of magnesium citrate and continue her stool softeners.    Charlesetta Shanks, MD 09/18/15 [phone]

## 2015-09-18 NOTE — Discharge Instructions (Signed)

## 2015-09-18 NOTE — ED Notes (Signed)
Bed: [id number] Expected date: 09/18/15 Expected time: 11:12 AM Means of arrival:  Comments: Fall- Head lac

## 2015-09-18 NOTE — ED Notes (Signed)
PT RECEIVED VIA EMS FOR A FALL. PT STATES SHE LOST HER BALANCE AND FELL BACKWARDS. PT HAS A SMALL ABRASION TO THE BACK OF THE HEAD. C-COLLAR IN PLACE PTA. PT C/O SLIGHT DIZZINESS AT THIS TIME, BUT STATES THAT IS NORMAL FOR HER. PT DENIES LOC.

## 2015-09-20 DIAGNOSIS — H04123 Dry eye syndrome of bilateral lacrimal glands: Secondary | ICD-10-CM | POA: Diagnosis not present

## 2015-09-20 DIAGNOSIS — H2513 Age-related nuclear cataract, bilateral: Secondary | ICD-10-CM | POA: Diagnosis not present

## 2015-09-20 DIAGNOSIS — H268 Other specified cataract: Secondary | ICD-10-CM | POA: Diagnosis not present

## 2015-09-20 DIAGNOSIS — H401131 Primary open-angle glaucoma, bilateral, mild stage: Secondary | ICD-10-CM | POA: Diagnosis not present

## 2015-09-20 DIAGNOSIS — H52203 Unspecified astigmatism, bilateral: Secondary | ICD-10-CM | POA: Diagnosis not present

## 2015-09-21 DIAGNOSIS — K219 Gastro-esophageal reflux disease without esophagitis: Secondary | ICD-10-CM | POA: Diagnosis not present

## 2015-09-21 DIAGNOSIS — D6489 Other specified anemias: Secondary | ICD-10-CM | POA: Diagnosis not present

## 2015-09-21 DIAGNOSIS — R2689 Other abnormalities of gait and mobility: Secondary | ICD-10-CM | POA: Diagnosis not present

## 2015-10-01 DIAGNOSIS — K219 Gastro-esophageal reflux disease without esophagitis: Secondary | ICD-10-CM | POA: Diagnosis not present

## 2015-10-01 DIAGNOSIS — R269 Unspecified abnormalities of gait and mobility: Secondary | ICD-10-CM | POA: Diagnosis not present

## 2015-10-04 DIAGNOSIS — R269 Unspecified abnormalities of gait and mobility: Secondary | ICD-10-CM | POA: Diagnosis not present

## 2015-10-04 DIAGNOSIS — K219 Gastro-esophageal reflux disease without esophagitis: Secondary | ICD-10-CM | POA: Diagnosis not present

## 2015-10-09 DIAGNOSIS — R269 Unspecified abnormalities of gait and mobility: Secondary | ICD-10-CM | POA: Diagnosis not present

## 2015-10-09 DIAGNOSIS — K219 Gastro-esophageal reflux disease without esophagitis: Secondary | ICD-10-CM | POA: Diagnosis not present

## 2015-10-18 DIAGNOSIS — Z9181 History of falling: Secondary | ICD-10-CM | POA: Diagnosis not present

## 2015-10-18 DIAGNOSIS — R32 Unspecified urinary incontinence: Secondary | ICD-10-CM | POA: Diagnosis not present

## 2015-10-26 ENCOUNTER — Encounter: Payer: Self-pay | Admitting: Gastroenterology

## 2015-10-26 ENCOUNTER — Ambulatory Visit (INDEPENDENT_AMBULATORY_CARE_PROVIDER_SITE_OTHER): Payer: PPO | Admitting: Gastroenterology

## 2015-10-26 VITALS — BP 92/60 | HR 76 | Ht 61.0 in | Wt 146.6 lb

## 2015-10-26 DIAGNOSIS — K5901 Slow transit constipation: Secondary | ICD-10-CM | POA: Diagnosis not present

## 2015-10-26 DIAGNOSIS — R159 Full incontinence of feces: Secondary | ICD-10-CM | POA: Diagnosis not present

## 2015-10-26 DIAGNOSIS — K219 Gastro-esophageal reflux disease without esophagitis: Secondary | ICD-10-CM

## 2015-10-26 NOTE — Progress Notes (Signed)
GI Progress Note  Chief Complaint: Constipation  Subjective History:  08/18/15 visit with me for recent epigastric pain, chronic IBS with constipation and urgency. Former patient of Dr. Olevia Perches for IBS She was unable to see me for a visit last month because she fell in the parking lot on her way into the building. She was seen in the ED that they unfortunately did not suffer any serious injury. Her mobility continues to be poor, her memory and attention seem to be worsening since I last saw her. She is again accompanied by her son, who lives with her. She is bothered by continued constipation, which is been a problem for decades. Even with the use of stool softeners and relaxed powder she has small pellet-like stools a few times a week. It should be noted that she has occasional episodes of incontinence which been very distressing. She denies rectal bleeding, her appetite is good and her weight stable. When she has not had good bowel movements, she becomes bloated and uncomfortable. ROS: Cardiovascular:  no chest pain Respiratory: no dyspnea  The patient's Past Medical, Family and Social History were reviewed and are on file in the EMR.  Objective:  Med list reviewed  Vital signs in last 24 hrs: Filed Vitals:   10/26/15 1139  BP: 92/60  Pulse: 76    Physical Exam She is a very slow unsteady gait, she needs much assistance to get on the exam table. Minnie is distracted and seems to lose her train of thought frequently.  HEENT: sclera anicteric, oral mucosa moist without lesions  Neck: supple, no thyromegaly, JVD or lymphadenopathy  Cardiac: RRR without murmurs, S1S2 heard, no peripheral edema  Pulm: clear to auscultation bilaterally, normal RR and effort noted  Abdomen: soft, mild scattered tenderness, with active bowel sounds. No guarding or palpable hepatosplenomegaly.  Skin; warm and dry, no jaundice or rash  Recent Labs:   CBC    Component Value Date/Time   WBC  7.8 07/23/2015 0447   RBC 3.63* 07/23/2015 0447   HGB 9.9* 07/23/2015 0447   HCT 30.3* 07/23/2015 0447   PLT 234 07/23/2015 0447   MCV 83.5 07/23/2015 0447   MCH 27.3 07/23/2015 0447   MCHC 32.7 07/23/2015 0447   RDW 14.4 07/23/2015 0447   LYMPHSABS 3.4 07/22/2015 1532   MONOABS 0.6 07/22/2015 1532   EOSABS 0.1 07/22/2015 1532   BASOSABS 0.0 07/22/2015 1532      @ASSESSMENTPLANBEGIN @ Assessment: Encounter Diagnoses  Name Primary?  . Slow transit constipation Yes  . Incontinence of feces   . Gastroesophageal reflux disease, esophagitis presence not specified    Baylen has multiple digestive issues, and the constipation is due to on standing IBS.  Her overall health and functional status are slowly declining. Fortunately, she has the assistance of her son at home.  Plan: I do not think Linzess or Amitiza would be right for her because there are very likely to cause diarrhea, which could then cause incontinence given her history and poor mobility. She is instructed to take a bottle of magnesium citrate every 1-2 weeks when she feels that she is getting on bloated and uncomfortable from the constipation. Continue stool softeners and MiraLAX in the meantime, and I think that is likely to be as good as it can get for this frail elderly woman with poor GI motility.   Total time 20 minutes, over half spent in counseling and coordination of care.  Topics discussed: The chronic nature and treatment of IBS  were discussed. The cause is not completely understood, but is likely to be a combination of genetics, diet, stress, visceral hypersensitivity and the gut microbiome.  The available treatments aim to control symptoms even if unable to "cure" the condition.  While not physically harmful, IBS can have a significant impact on quality of life.    Nelida Meuse III

## 2015-10-26 NOTE — Patient Instructions (Addendum)
Constipation:  Plenty of water and dietary fiber.  Once every week or every other week (depending upon how you feel with constipation) consume one bottle of magnesium citrate.  Be sure you are at home for this and you have some help getting to the bathroom.  If you are age 72 or older, your body mass index should be between 23-30. Your Body mass index is 27.71 kg/(m^2). If this is out of the aforementioned range listed, please consider follow up with your Primary Care Provider.  If you are age 66 or younger, your body mass index should be between 19-25. Your Body mass index is 27.71 kg/(m^2). If this is out of the aformentioned range listed, please consider follow up with your Primary Care Provider.    Constipation, Adult Constipation is when a person has fewer than three bowel movements a week, has difficulty having a bowel movement, or has stools that are dry, hard, or larger than normal. As people grow older, constipation is more common. A low-fiber diet, not taking in enough fluids, and taking certain medicines may make constipation worse.  CAUSES   Certain medicines, such as antidepressants, pain medicine, iron supplements, antacids, and water pills.   Certain diseases, such as diabetes, irritable bowel syndrome (IBS), thyroid disease, or depression.   Not drinking enough water.   Not eating enough fiber-rich foods.   Stress or travel.   Lack of physical activity or exercise.   Ignoring the urge to have a bowel movement.   Using laxatives too much.  SIGNS AND SYMPTOMS   Having fewer than three bowel movements a week.   Straining to have a bowel movement.   Having stools that are hard, dry, or larger than normal.   Feeling full or bloated.   Pain in the lower abdomen.   Not feeling relief after having a bowel movement.  DIAGNOSIS  Your health care provider will take a medical history and perform a physical exam. Further testing may be done for severe  constipation. Some tests may include:  A barium enema X-ray to examine your rectum, colon, and, sometimes, your small intestine.   A sigmoidoscopy to examine your lower colon.   A colonoscopy to examine your entire colon. TREATMENT  Treatment will depend on the severity of your constipation and what is causing it. Some dietary treatments include drinking more fluids and eating more fiber-rich foods. Lifestyle treatments may include regular exercise. If these diet and lifestyle recommendations do not help, your health care provider may recommend taking over-the-counter laxative medicines to help you have bowel movements. Prescription medicines may be prescribed if over-the-counter medicines do not work.  HOME CARE INSTRUCTIONS   Eat foods that have a lot of fiber, such as fruits, vegetables, whole grains, and beans.  Limit foods high in fat and processed sugars, such as french fries, hamburgers, cookies, candies, and soda.   A fiber supplement may be added to your diet if you cannot get enough fiber from foods.   Drink enough fluids to keep your urine clear or pale yellow.   Exercise regularly or as directed by your health care provider.   Go to the restroom when you have the urge to go. Do not hold it.   Only take over-the-counter or prescription medicines as directed by your health care provider. Do not take other medicines for constipation without talking to your health care provider first.  [place] IF:   You have bright red blood in your stool.  Your constipation lasts for more than 4 days or gets worse.   You have abdominal or rectal pain.   You have thin, pencil-like stools.   You have unexplained weight loss. MAKE SURE YOU:   Understand these instructions.  Will watch your condition.  Will get help right away if you are not doing well or get worse.   This information is not intended to replace advice given to you by your health care  provider. Make sure you discuss any questions you have with your health care provider.   Document Released: 12/22/2003 Document Revised: 04/15/2014 Document Reviewed: 01/04/2013 Elsevier Interactive Patient Education [employer].

## 2015-11-27 DIAGNOSIS — F419 Anxiety disorder, unspecified: Secondary | ICD-10-CM | POA: Diagnosis not present

## 2015-11-27 DIAGNOSIS — F330 Major depressive disorder, recurrent, mild: Secondary | ICD-10-CM | POA: Diagnosis not present

## 2016-01-08 DIAGNOSIS — R42 Dizziness and giddiness: Secondary | ICD-10-CM | POA: Diagnosis not present

## 2016-01-08 DIAGNOSIS — G4733 Obstructive sleep apnea (adult) (pediatric): Secondary | ICD-10-CM | POA: Diagnosis not present

## 2016-01-08 DIAGNOSIS — R2689 Other abnormalities of gait and mobility: Secondary | ICD-10-CM | POA: Diagnosis not present

## 2016-01-08 DIAGNOSIS — G4700 Insomnia, unspecified: Secondary | ICD-10-CM | POA: Diagnosis not present

## 2016-01-23 DIAGNOSIS — R2689 Other abnormalities of gait and mobility: Secondary | ICD-10-CM | POA: Diagnosis not present

## 2016-01-23 DIAGNOSIS — R42 Dizziness and giddiness: Secondary | ICD-10-CM | POA: Diagnosis not present

## 2016-01-24 DIAGNOSIS — H524 Presbyopia: Secondary | ICD-10-CM | POA: Diagnosis not present

## 2016-01-24 DIAGNOSIS — H04123 Dry eye syndrome of bilateral lacrimal glands: Secondary | ICD-10-CM | POA: Diagnosis not present

## 2016-01-24 DIAGNOSIS — H268 Other specified cataract: Secondary | ICD-10-CM | POA: Diagnosis not present

## 2016-01-24 DIAGNOSIS — H401131 Primary open-angle glaucoma, bilateral, mild stage: Secondary | ICD-10-CM | POA: Diagnosis not present

## 2016-01-24 DIAGNOSIS — H52203 Unspecified astigmatism, bilateral: Secondary | ICD-10-CM | POA: Diagnosis not present

## 2016-01-24 DIAGNOSIS — H43393 Other vitreous opacities, bilateral: Secondary | ICD-10-CM | POA: Diagnosis not present

## 2016-01-24 DIAGNOSIS — Z83511 Family history of glaucoma: Secondary | ICD-10-CM | POA: Diagnosis not present

## 2016-01-24 DIAGNOSIS — H2513 Age-related nuclear cataract, bilateral: Secondary | ICD-10-CM | POA: Diagnosis not present

## 2016-02-26 DIAGNOSIS — F609 Personality disorder, unspecified: Secondary | ICD-10-CM | POA: Diagnosis not present

## 2016-02-26 DIAGNOSIS — G4733 Obstructive sleep apnea (adult) (pediatric): Secondary | ICD-10-CM | POA: Diagnosis not present

## 2016-02-26 DIAGNOSIS — F330 Major depressive disorder, recurrent, mild: Secondary | ICD-10-CM | POA: Diagnosis not present

## 2016-02-26 DIAGNOSIS — F09 Unspecified mental disorder due to known physiological condition: Secondary | ICD-10-CM | POA: Diagnosis not present

## 2016-02-26 DIAGNOSIS — F419 Anxiety disorder, unspecified: Secondary | ICD-10-CM | POA: Diagnosis not present

## 2016-04-08 DIAGNOSIS — D491 Neoplasm of unspecified behavior of respiratory system: Secondary | ICD-10-CM

## 2016-04-08 HISTORY — DX: Neoplasm of unspecified behavior of respiratory system: D49.1

## 2016-05-13 DIAGNOSIS — F330 Major depressive disorder, recurrent, mild: Secondary | ICD-10-CM | POA: Diagnosis not present

## 2016-05-17 DIAGNOSIS — E119 Type 2 diabetes mellitus without complications: Secondary | ICD-10-CM | POA: Diagnosis not present

## 2016-05-17 DIAGNOSIS — Z23 Encounter for immunization: Secondary | ICD-10-CM | POA: Diagnosis not present

## 2016-05-17 DIAGNOSIS — R918 Other nonspecific abnormal finding of lung field: Secondary | ICD-10-CM | POA: Diagnosis not present

## 2016-05-17 DIAGNOSIS — R739 Hyperglycemia, unspecified: Secondary | ICD-10-CM | POA: Diagnosis not present

## 2016-05-17 DIAGNOSIS — Z0000 Encounter for general adult medical examination without abnormal findings: Secondary | ICD-10-CM | POA: Diagnosis not present

## 2016-05-17 DIAGNOSIS — E784 Other hyperlipidemia: Secondary | ICD-10-CM | POA: Diagnosis not present

## 2016-05-17 DIAGNOSIS — E039 Hypothyroidism, unspecified: Secondary | ICD-10-CM | POA: Diagnosis not present

## 2016-06-06 DIAGNOSIS — R918 Other nonspecific abnormal finding of lung field: Secondary | ICD-10-CM | POA: Diagnosis not present

## 2016-07-17 DIAGNOSIS — G4733 Obstructive sleep apnea (adult) (pediatric): Secondary | ICD-10-CM | POA: Diagnosis not present

## 2016-07-17 DIAGNOSIS — R918 Other nonspecific abnormal finding of lung field: Secondary | ICD-10-CM | POA: Diagnosis not present

## 2016-07-17 DIAGNOSIS — Z87891 Personal history of nicotine dependence: Secondary | ICD-10-CM | POA: Diagnosis not present

## 2016-07-17 DIAGNOSIS — R0602 Shortness of breath: Secondary | ICD-10-CM | POA: Diagnosis not present

## 2016-08-02 IMAGING — CT CT HEAD W/O CM
3 of 4 series · 15 of 47 positions shown, 18 images · non-contrast
Comparison: 10/21/2011

CLINICAL DATA: Patient lost balance fell backwards striking back of
head.

EXAM:
CT HEAD WITHOUT CONTRAST
TECHNIQUE: Contiguous axial images were obtained from the base of the skull
through the vertex without intravenous contrast.

[Series 4: head w/o · axial · non-contrast · 0.45mm/px · z∈[-125,-5]mm · 9 of 30 slices shown, 12 images]
[im 3/30  brain]
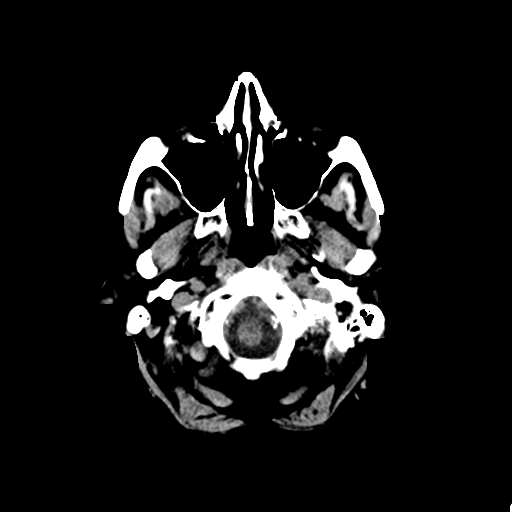
[im 3/30  bone]
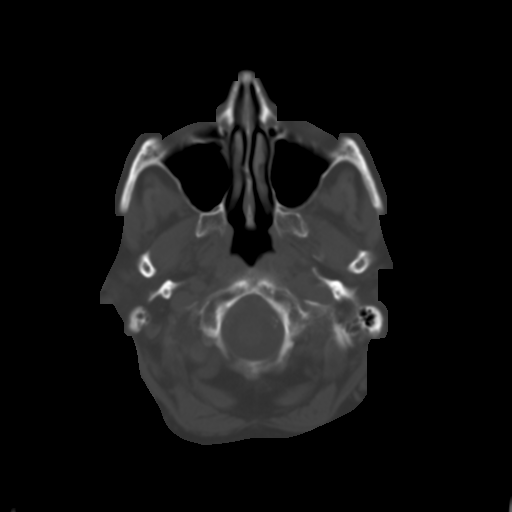
[im 6/30  brain]
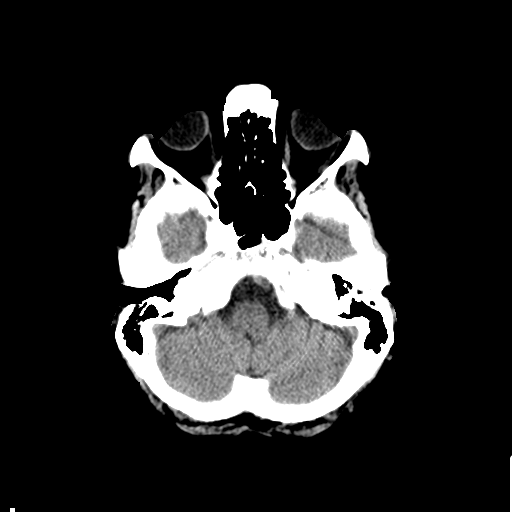
[im 9/30  brain]
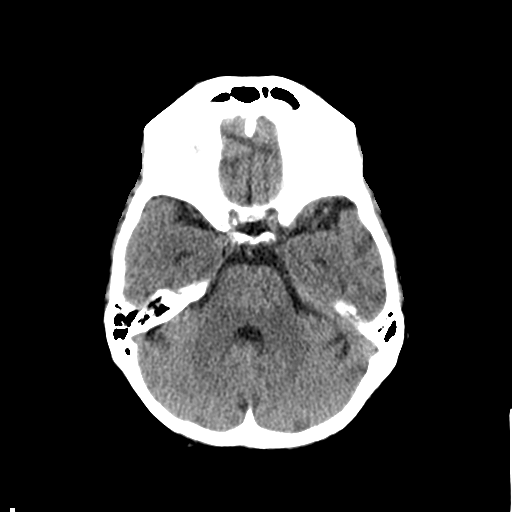
[im 12/30  brain]
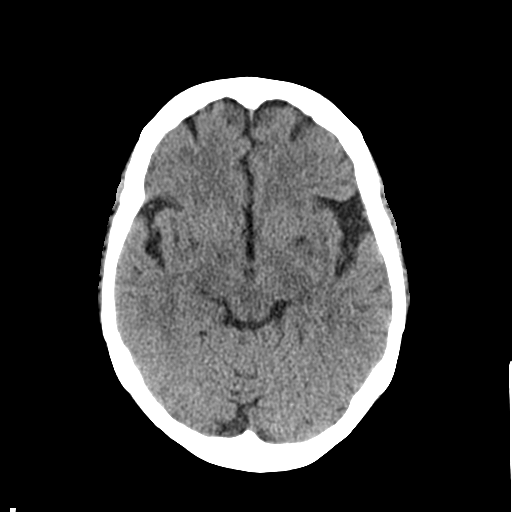
[im 15/30  brain]
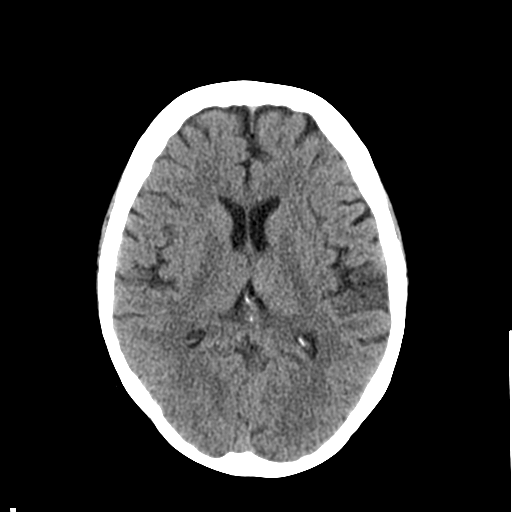
[im 15/30  bone]
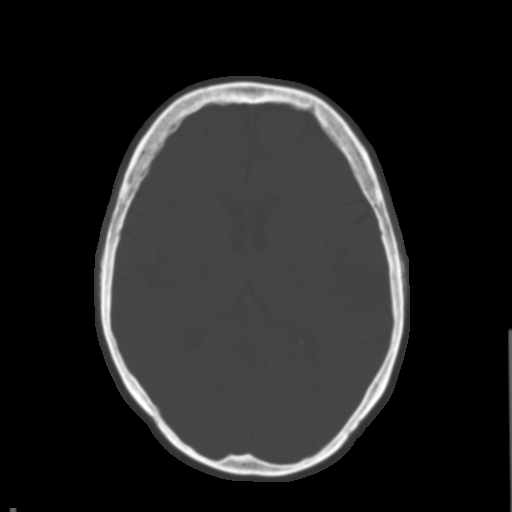
[im 18/30  brain]
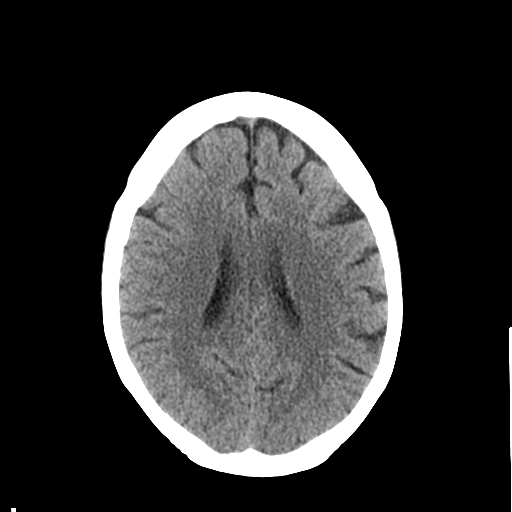
[im 21/30  brain]
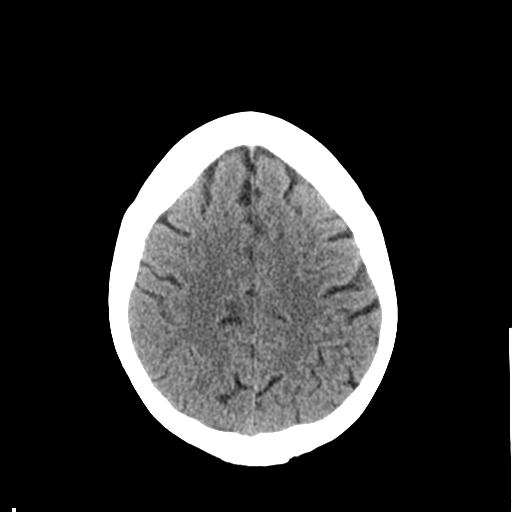
[im 24/30  brain]
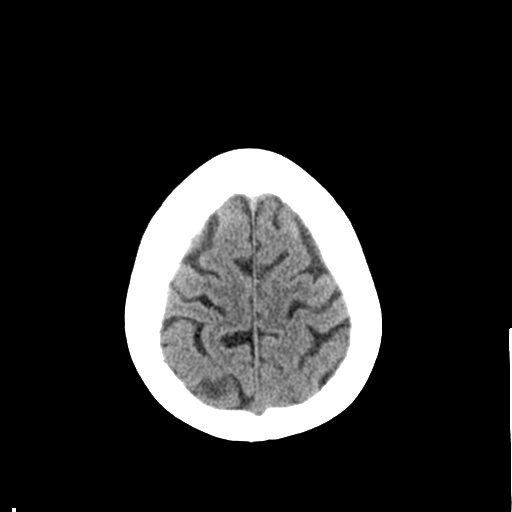
[im 27/30  brain]
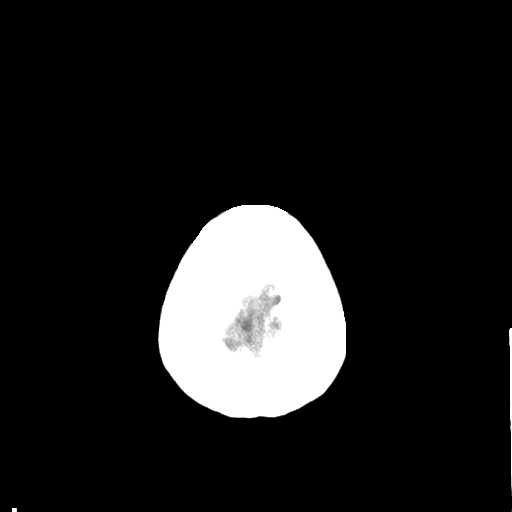
[im 27/30  bone]
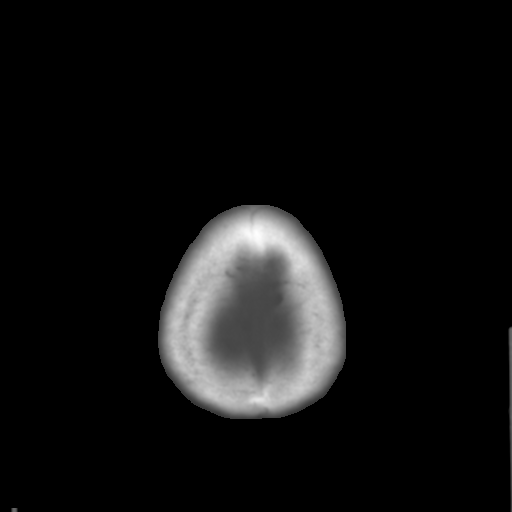

[Series 5: coronal · coronal · 0.28mm/px · 3 of 76 slices shown]
[im 26/76  brain]
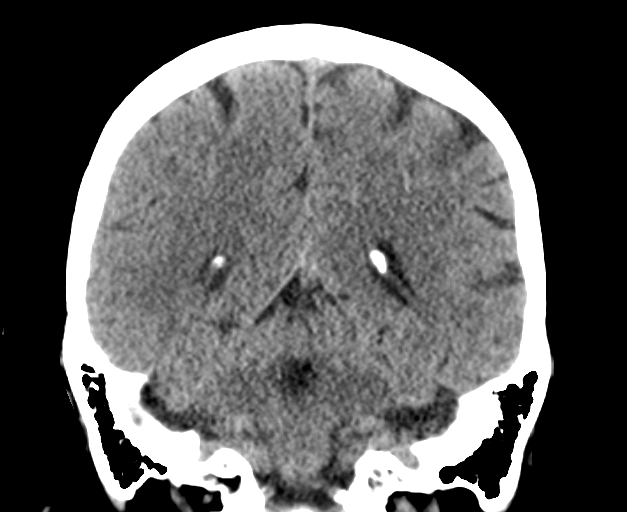
[im 34/76  brain]
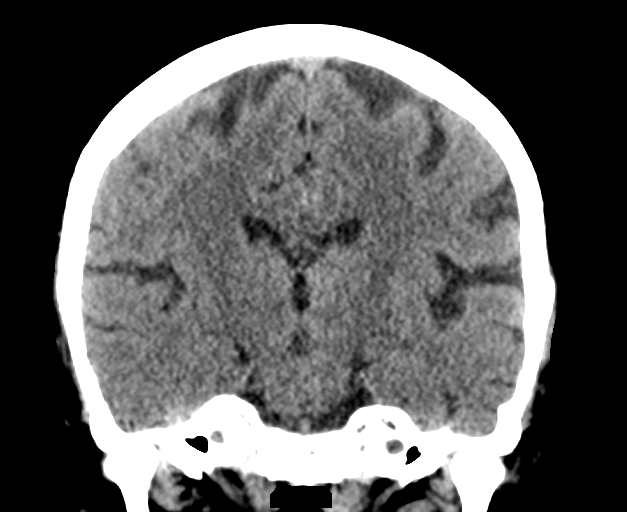
[im 42/76  brain]
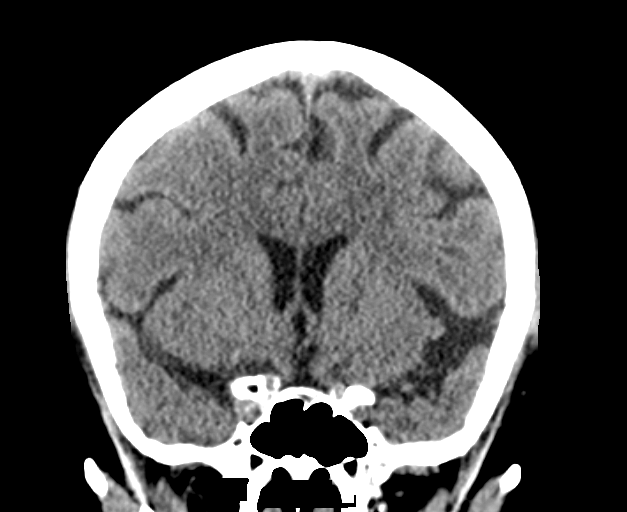

[Series 6: sagittal · sagittal · 0.31mm/px · 3 of 60 slices shown]
[im 20/60  brain]
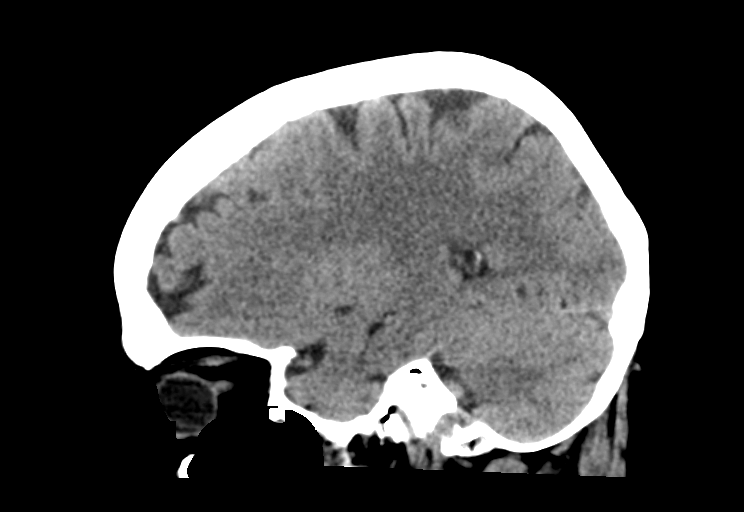
[im 30/60  brain]
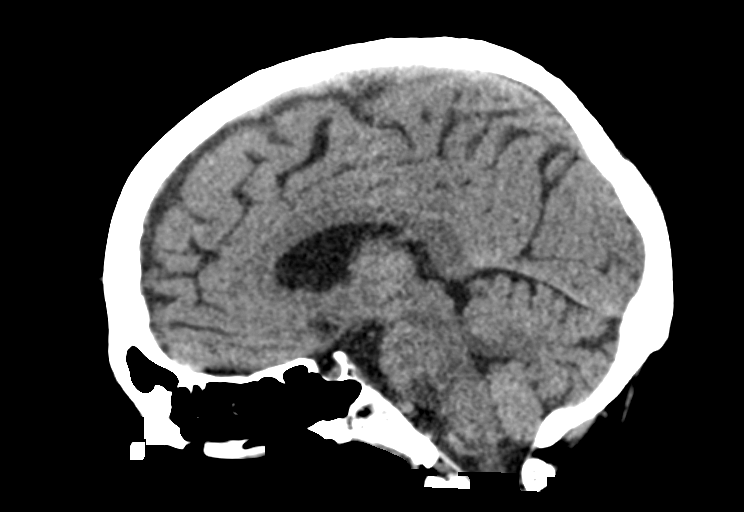
[im 40/60  brain]
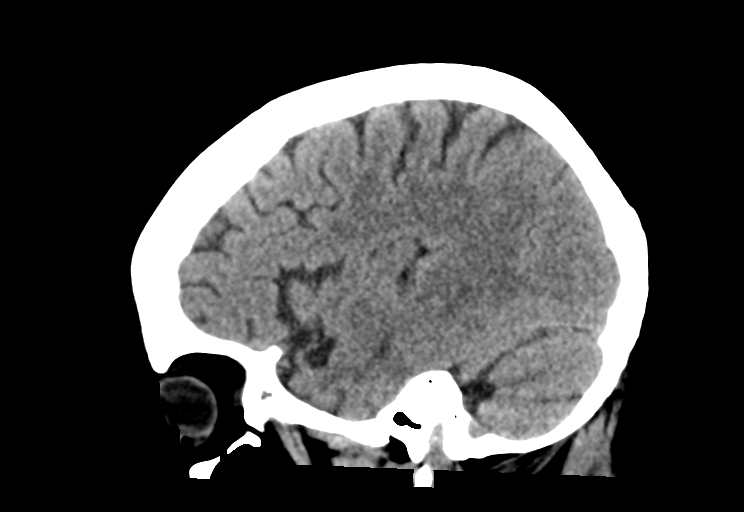

[15 of 47 positions shown; findings below may reference images not displayed]

FINDINGS: There is no evidence for acute hemorrhage, hydrocephalus, mass
lesion, or abnormal extra-axial fluid collection. No definite CT
evidence for acute infarction. Lacunar infarcts noted in the left
basal ganglia, stable in the interval. The visualized paranasal
sinuses and mastoid air cells are clear.
IMPRESSION: Stable.  No acute intracranial abnormality.

## 2016-08-06 DIAGNOSIS — F09 Unspecified mental disorder due to known physiological condition: Secondary | ICD-10-CM | POA: Diagnosis not present

## 2016-08-06 DIAGNOSIS — F330 Major depressive disorder, recurrent, mild: Secondary | ICD-10-CM | POA: Diagnosis not present

## 2016-09-09 ENCOUNTER — Encounter: Payer: Self-pay | Admitting: *Deleted

## 2016-09-09 ENCOUNTER — Other Ambulatory Visit: Payer: Self-pay | Admitting: *Deleted

## 2016-09-09 NOTE — Patient Outreach (Signed)
[facility]) Care Management  09/09/2016  Brianna [name] September 21, 1945 [id number]    RN Health Coach telephone call to patient.  [employer] compliance verified. This is a self referral through EMMI. This patient can afford her medications. She does not have any questions for pharmacy. She takes her medications as ordered.  She has transportation to her appointments. Per patient she had fallen a few months back and hit her head. She does not use a cane or walker. Her son is her caregiver. She does not health condition needs at this time. Per patient no Education officer, museum needs at this time.  Plan: RN will send a brochure and magnet to patient  [facility] [phone]

## 2016-10-23 DIAGNOSIS — H04123 Dry eye syndrome of bilateral lacrimal glands: Secondary | ICD-10-CM | POA: Diagnosis not present

## 2016-10-23 DIAGNOSIS — H401131 Primary open-angle glaucoma, bilateral, mild stage: Secondary | ICD-10-CM | POA: Diagnosis not present

## 2016-12-19 DIAGNOSIS — F330 Major depressive disorder, recurrent, mild: Secondary | ICD-10-CM | POA: Diagnosis not present

## 2016-12-19 DIAGNOSIS — F411 Generalized anxiety disorder: Secondary | ICD-10-CM | POA: Diagnosis not present

## 2016-12-19 DIAGNOSIS — F09 Unspecified mental disorder due to known physiological condition: Secondary | ICD-10-CM | POA: Diagnosis not present

## 2017-02-21 DIAGNOSIS — R918 Other nonspecific abnormal finding of lung field: Secondary | ICD-10-CM | POA: Diagnosis not present

## 2017-02-21 DIAGNOSIS — R739 Hyperglycemia, unspecified: Secondary | ICD-10-CM | POA: Diagnosis not present

## 2017-02-21 DIAGNOSIS — E039 Hypothyroidism, unspecified: Secondary | ICD-10-CM | POA: Diagnosis not present

## 2017-02-21 DIAGNOSIS — Z23 Encounter for immunization: Secondary | ICD-10-CM | POA: Diagnosis not present

## 2017-02-21 DIAGNOSIS — E785 Hyperlipidemia, unspecified: Secondary | ICD-10-CM | POA: Diagnosis not present

## 2017-03-12 DIAGNOSIS — H52203 Unspecified astigmatism, bilateral: Secondary | ICD-10-CM | POA: Diagnosis not present

## 2017-03-12 DIAGNOSIS — H401131 Primary open-angle glaucoma, bilateral, mild stage: Secondary | ICD-10-CM | POA: Diagnosis not present

## 2017-03-12 DIAGNOSIS — H2513 Age-related nuclear cataract, bilateral: Secondary | ICD-10-CM | POA: Diagnosis not present

## 2017-03-12 DIAGNOSIS — H04123 Dry eye syndrome of bilateral lacrimal glands: Secondary | ICD-10-CM | POA: Diagnosis not present

## 2017-03-12 DIAGNOSIS — H524 Presbyopia: Secondary | ICD-10-CM | POA: Diagnosis not present

## 2017-03-12 DIAGNOSIS — H268 Other specified cataract: Secondary | ICD-10-CM | POA: Diagnosis not present

## 2017-04-16 DIAGNOSIS — R0982 Postnasal drip: Secondary | ICD-10-CM | POA: Diagnosis not present

## 2017-04-16 DIAGNOSIS — J0110 Acute frontal sinusitis, unspecified: Secondary | ICD-10-CM | POA: Diagnosis not present

## 2017-04-16 DIAGNOSIS — J309 Allergic rhinitis, unspecified: Secondary | ICD-10-CM | POA: Diagnosis not present

## 2017-06-22 ENCOUNTER — Encounter: Payer: Self-pay | Admitting: Gastroenterology

## 2017-06-27 ENCOUNTER — Encounter: Payer: Self-pay | Admitting: Gastroenterology

## 2017-07-11 DIAGNOSIS — H401131 Primary open-angle glaucoma, bilateral, mild stage: Secondary | ICD-10-CM | POA: Diagnosis not present

## 2017-08-20 ENCOUNTER — Encounter: Payer: PPO | Admitting: Gastroenterology

## 2017-08-20 DIAGNOSIS — E079 Disorder of thyroid, unspecified: Secondary | ICD-10-CM | POA: Diagnosis not present

## 2017-08-20 DIAGNOSIS — R9431 Abnormal electrocardiogram [ECG] [EKG]: Secondary | ICD-10-CM | POA: Diagnosis not present

## 2017-08-20 DIAGNOSIS — R42 Dizziness and giddiness: Secondary | ICD-10-CM | POA: Diagnosis not present

## 2017-08-20 DIAGNOSIS — R531 Weakness: Secondary | ICD-10-CM | POA: Diagnosis not present

## 2017-08-20 DIAGNOSIS — Z79899 Other long term (current) drug therapy: Secondary | ICD-10-CM | POA: Diagnosis not present

## 2017-08-20 DIAGNOSIS — Z7982 Long term (current) use of aspirin: Secondary | ICD-10-CM | POA: Diagnosis not present

## 2017-08-20 DIAGNOSIS — Z888 Allergy status to other drugs, medicaments and biological substances status: Secondary | ICD-10-CM | POA: Diagnosis not present

## 2017-11-04 DIAGNOSIS — M5136 Other intervertebral disc degeneration, lumbar region: Secondary | ICD-10-CM | POA: Diagnosis not present

## 2017-11-04 DIAGNOSIS — M25551 Pain in right hip: Secondary | ICD-10-CM | POA: Diagnosis not present

## 2017-11-07 DIAGNOSIS — M1611 Unilateral primary osteoarthritis, right hip: Secondary | ICD-10-CM | POA: Diagnosis not present

## 2017-11-07 DIAGNOSIS — K589 Irritable bowel syndrome without diarrhea: Secondary | ICD-10-CM | POA: Diagnosis not present

## 2017-11-07 DIAGNOSIS — M8580 Other specified disorders of bone density and structure, unspecified site: Secondary | ICD-10-CM | POA: Diagnosis not present

## 2017-11-07 DIAGNOSIS — M5136 Other intervertebral disc degeneration, lumbar region: Secondary | ICD-10-CM | POA: Diagnosis not present

## 2017-11-07 DIAGNOSIS — N183 Chronic kidney disease, stage 3 (moderate): Secondary | ICD-10-CM | POA: Diagnosis not present

## 2017-11-17 DIAGNOSIS — H401131 Primary open-angle glaucoma, bilateral, mild stage: Secondary | ICD-10-CM | POA: Diagnosis not present

## 2017-11-17 DIAGNOSIS — H268 Other specified cataract: Secondary | ICD-10-CM | POA: Diagnosis not present

## 2017-11-17 DIAGNOSIS — H04123 Dry eye syndrome of bilateral lacrimal glands: Secondary | ICD-10-CM | POA: Diagnosis not present

## 2017-11-20 DIAGNOSIS — N816 Rectocele: Secondary | ICD-10-CM | POA: Diagnosis not present

## 2017-11-20 DIAGNOSIS — N3941 Urge incontinence: Secondary | ICD-10-CM | POA: Diagnosis not present

## 2017-12-02 DIAGNOSIS — Z466 Encounter for fitting and adjustment of urinary device: Secondary | ICD-10-CM | POA: Diagnosis not present

## 2017-12-02 DIAGNOSIS — N816 Rectocele: Secondary | ICD-10-CM | POA: Diagnosis not present

## 2017-12-02 DIAGNOSIS — N8111 Cystocele, midline: Secondary | ICD-10-CM | POA: Diagnosis not present

## 2017-12-02 DIAGNOSIS — N952 Postmenopausal atrophic vaginitis: Secondary | ICD-10-CM | POA: Diagnosis not present

## 2017-12-23 DIAGNOSIS — M5136 Other intervertebral disc degeneration, lumbar region: Secondary | ICD-10-CM | POA: Diagnosis not present

## 2017-12-23 DIAGNOSIS — K589 Irritable bowel syndrome without diarrhea: Secondary | ICD-10-CM | POA: Diagnosis not present

## 2017-12-23 DIAGNOSIS — M8580 Other specified disorders of bone density and structure, unspecified site: Secondary | ICD-10-CM | POA: Diagnosis not present

## 2017-12-23 DIAGNOSIS — N183 Chronic kidney disease, stage 3 (moderate): Secondary | ICD-10-CM | POA: Diagnosis not present

## 2017-12-23 DIAGNOSIS — M1611 Unilateral primary osteoarthritis, right hip: Secondary | ICD-10-CM | POA: Diagnosis not present

## 2017-12-30 DIAGNOSIS — N183 Chronic kidney disease, stage 3 (moderate): Secondary | ICD-10-CM | POA: Diagnosis not present

## 2017-12-30 DIAGNOSIS — M1611 Unilateral primary osteoarthritis, right hip: Secondary | ICD-10-CM | POA: Diagnosis not present

## 2017-12-30 DIAGNOSIS — M8580 Other specified disorders of bone density and structure, unspecified site: Secondary | ICD-10-CM | POA: Diagnosis not present

## 2017-12-30 DIAGNOSIS — K589 Irritable bowel syndrome without diarrhea: Secondary | ICD-10-CM | POA: Diagnosis not present

## 2017-12-30 DIAGNOSIS — M5136 Other intervertebral disc degeneration, lumbar region: Secondary | ICD-10-CM | POA: Diagnosis not present

## 2018-02-05 DIAGNOSIS — E039 Hypothyroidism, unspecified: Secondary | ICD-10-CM | POA: Diagnosis not present

## 2018-02-05 DIAGNOSIS — R739 Hyperglycemia, unspecified: Secondary | ICD-10-CM | POA: Diagnosis not present

## 2018-02-05 DIAGNOSIS — E785 Hyperlipidemia, unspecified: Secondary | ICD-10-CM | POA: Diagnosis not present

## 2018-02-05 DIAGNOSIS — R918 Other nonspecific abnormal finding of lung field: Secondary | ICD-10-CM | POA: Diagnosis not present

## 2018-03-25 DIAGNOSIS — Z83511 Family history of glaucoma: Secondary | ICD-10-CM | POA: Diagnosis not present

## 2018-03-25 DIAGNOSIS — H2513 Age-related nuclear cataract, bilateral: Secondary | ICD-10-CM | POA: Diagnosis not present

## 2018-03-25 DIAGNOSIS — H04123 Dry eye syndrome of bilateral lacrimal glands: Secondary | ICD-10-CM | POA: Diagnosis not present

## 2018-03-25 DIAGNOSIS — H52203 Unspecified astigmatism, bilateral: Secondary | ICD-10-CM | POA: Diagnosis not present

## 2018-03-25 DIAGNOSIS — H268 Other specified cataract: Secondary | ICD-10-CM | POA: Diagnosis not present

## 2018-03-25 DIAGNOSIS — H524 Presbyopia: Secondary | ICD-10-CM | POA: Diagnosis not present

## 2018-03-25 DIAGNOSIS — H43393 Other vitreous opacities, bilateral: Secondary | ICD-10-CM | POA: Diagnosis not present

## 2018-03-25 DIAGNOSIS — H401131 Primary open-angle glaucoma, bilateral, mild stage: Secondary | ICD-10-CM | POA: Diagnosis not present

## 2018-03-25 DIAGNOSIS — H3561 Retinal hemorrhage, right eye: Secondary | ICD-10-CM | POA: Diagnosis not present

## 2018-04-16 ENCOUNTER — Encounter: Payer: Self-pay | Admitting: Gastroenterology

## 2018-05-05 ENCOUNTER — Ambulatory Visit (AMBULATORY_SURGERY_CENTER): Payer: Self-pay

## 2018-05-05 ENCOUNTER — Encounter: Payer: Self-pay | Admitting: Gastroenterology

## 2018-05-05 VITALS — Ht 61.0 in | Wt 146.6 lb

## 2018-05-05 DIAGNOSIS — Z1211 Encounter for screening for malignant neoplasm of colon: Secondary | ICD-10-CM

## 2018-05-05 MED ORDER — PEG-KCL-NACL-NASULF-NA ASC-C 140 G PO SOLR: 1.0000 | Freq: Once | ORAL | 0 refills | Status: AC

## 2018-05-05 NOTE — Progress Notes (Signed)
Denies allergies to eggs or soy products. Denies complication of anesthesia or sedation. Denies use of weight loss medication. Denies use of O2.   Emmi instructions declined.   A sample of Plenvu was given to the patient.  

## 2018-05-19 ENCOUNTER — Encounter: Payer: PPO | Admitting: Gastroenterology

## 2018-05-19 ENCOUNTER — Telehealth: Payer: Self-pay | Admitting: *Deleted

## 2018-05-19 NOTE — Telephone Encounter (Signed)
Please reschedule with a Ducolax and Miralax/Gatorade prep

## 2018-05-19 NOTE — Telephone Encounter (Signed)
Dr Henrene Pastor called to let you know that he received call from Mrs Blixt last night that she could not tolerate the prep.  He offered her an alternative but she did not have transportation.  We have canceled appointment and will notify  schedulers to reschedule her with a different prep if you can please advise on the prep.  Thank you! Janeth Rase.

## 2018-06-12 NOTE — Telephone Encounter (Signed)
Left message to return call 

## 2018-06-12 NOTE — Telephone Encounter (Signed)
Patient has been scheduled for a colonoscopy with Miralax on 07-22-2018

## 2018-07-09 ENCOUNTER — Telehealth: Payer: Self-pay | Admitting: *Deleted

## 2018-07-09 NOTE — Telephone Encounter (Signed)
LM on VM for patient informing her that her 4/15 appointment has been cancelled.  Asked her to call back to confirm that she received this message.

## 2018-07-22 ENCOUNTER — Encounter: Payer: PPO | Admitting: Gastroenterology

## 2018-08-17 ENCOUNTER — Telehealth: Payer: Self-pay | Admitting: *Deleted

## 2018-08-17 NOTE — Telephone Encounter (Signed)
Rs colon with pt for 6-26 Friday at 130 pm- per TE 2-11 pt is to have Miralax/ Gatorade with Dulcolax as prep- Pt aware I am mailing new instructions  Lelan Pons PV

## 2018-10-02 ENCOUNTER — Encounter: Payer: PPO | Admitting: Gastroenterology

## 2018-10-29 DIAGNOSIS — G4700 Insomnia, unspecified: Secondary | ICD-10-CM | POA: Diagnosis not present

## 2018-10-29 DIAGNOSIS — R739 Hyperglycemia, unspecified: Secondary | ICD-10-CM | POA: Diagnosis not present

## 2018-10-29 DIAGNOSIS — Z7982 Long term (current) use of aspirin: Secondary | ICD-10-CM | POA: Diagnosis not present

## 2018-10-29 DIAGNOSIS — E785 Hyperlipidemia, unspecified: Secondary | ICD-10-CM | POA: Diagnosis not present

## 2018-10-29 DIAGNOSIS — E039 Hypothyroidism, unspecified: Secondary | ICD-10-CM | POA: Diagnosis not present

## 2018-10-29 DIAGNOSIS — M25551 Pain in right hip: Secondary | ICD-10-CM | POA: Diagnosis not present

## 2018-12-17 DIAGNOSIS — E785 Hyperlipidemia, unspecified: Secondary | ICD-10-CM | POA: Diagnosis not present

## 2018-12-17 DIAGNOSIS — G4700 Insomnia, unspecified: Secondary | ICD-10-CM | POA: Diagnosis not present

## 2018-12-17 DIAGNOSIS — R739 Hyperglycemia, unspecified: Secondary | ICD-10-CM | POA: Diagnosis not present

## 2018-12-17 DIAGNOSIS — E039 Hypothyroidism, unspecified: Secondary | ICD-10-CM | POA: Diagnosis not present

## 2019-03-08 DIAGNOSIS — Z1211 Encounter for screening for malignant neoplasm of colon: Secondary | ICD-10-CM | POA: Diagnosis not present

## 2019-04-27 DIAGNOSIS — E039 Hypothyroidism, unspecified: Secondary | ICD-10-CM | POA: Diagnosis not present

## 2019-04-27 DIAGNOSIS — Z20822 Contact with and (suspected) exposure to covid-19: Secondary | ICD-10-CM | POA: Diagnosis not present

## 2019-04-27 DIAGNOSIS — R739 Hyperglycemia, unspecified: Secondary | ICD-10-CM | POA: Diagnosis not present

## 2019-04-27 DIAGNOSIS — G4700 Insomnia, unspecified: Secondary | ICD-10-CM | POA: Diagnosis not present

## 2019-04-27 DIAGNOSIS — R918 Other nonspecific abnormal finding of lung field: Secondary | ICD-10-CM | POA: Diagnosis not present

## 2019-04-27 DIAGNOSIS — D649 Anemia, unspecified: Secondary | ICD-10-CM | POA: Diagnosis not present

## 2019-04-27 DIAGNOSIS — E785 Hyperlipidemia, unspecified: Secondary | ICD-10-CM | POA: Diagnosis not present

## 2019-06-08 DIAGNOSIS — N3000 Acute cystitis without hematuria: Secondary | ICD-10-CM | POA: Diagnosis not present

## 2019-06-08 DIAGNOSIS — D649 Anemia, unspecified: Secondary | ICD-10-CM | POA: Diagnosis not present

## 2019-06-08 DIAGNOSIS — E039 Hypothyroidism, unspecified: Secondary | ICD-10-CM | POA: Diagnosis not present

## 2019-06-08 DIAGNOSIS — N3090 Cystitis, unspecified without hematuria: Secondary | ICD-10-CM | POA: Diagnosis not present

## 2019-06-08 DIAGNOSIS — R739 Hyperglycemia, unspecified: Secondary | ICD-10-CM | POA: Diagnosis not present

## 2019-06-08 DIAGNOSIS — E785 Hyperlipidemia, unspecified: Secondary | ICD-10-CM | POA: Diagnosis not present

## 2019-06-08 DIAGNOSIS — M545 Low back pain: Secondary | ICD-10-CM | POA: Diagnosis not present

## 2019-06-16 DIAGNOSIS — R0602 Shortness of breath: Secondary | ICD-10-CM | POA: Diagnosis not present

## 2019-06-16 DIAGNOSIS — Z8616 Personal history of COVID-19: Secondary | ICD-10-CM | POA: Diagnosis not present

## 2019-06-16 DIAGNOSIS — D492 Neoplasm of unspecified behavior of bone, soft tissue, and skin: Secondary | ICD-10-CM | POA: Diagnosis not present

## 2019-06-16 DIAGNOSIS — G4733 Obstructive sleep apnea (adult) (pediatric): Secondary | ICD-10-CM | POA: Diagnosis not present

## 2019-06-16 DIAGNOSIS — J309 Allergic rhinitis, unspecified: Secondary | ICD-10-CM | POA: Diagnosis not present

## 2019-06-16 DIAGNOSIS — U071 COVID-19: Secondary | ICD-10-CM | POA: Diagnosis not present

## 2019-06-16 DIAGNOSIS — R918 Other nonspecific abnormal finding of lung field: Secondary | ICD-10-CM | POA: Diagnosis not present

## 2019-06-21 DIAGNOSIS — Z1231 Encounter for screening mammogram for malignant neoplasm of breast: Secondary | ICD-10-CM | POA: Diagnosis not present

## 2019-07-14 DIAGNOSIS — R0602 Shortness of breath: Secondary | ICD-10-CM | POA: Diagnosis not present

## 2019-07-14 DIAGNOSIS — R918 Other nonspecific abnormal finding of lung field: Secondary | ICD-10-CM | POA: Diagnosis not present

## 2019-07-14 DIAGNOSIS — Z8616 Personal history of COVID-19: Secondary | ICD-10-CM | POA: Diagnosis not present

## 2019-07-14 DIAGNOSIS — D492 Neoplasm of unspecified behavior of bone, soft tissue, and skin: Secondary | ICD-10-CM | POA: Diagnosis not present

## 2019-07-14 DIAGNOSIS — I700 Atherosclerosis of aorta: Secondary | ICD-10-CM | POA: Diagnosis not present

## 2019-07-16 DIAGNOSIS — D481 Neoplasm of uncertain behavior of connective and other soft tissue: Secondary | ICD-10-CM | POA: Diagnosis not present

## 2019-07-16 DIAGNOSIS — Z8616 Personal history of COVID-19: Secondary | ICD-10-CM | POA: Diagnosis not present

## 2019-07-16 DIAGNOSIS — G4733 Obstructive sleep apnea (adult) (pediatric): Secondary | ICD-10-CM | POA: Diagnosis not present

## 2019-07-16 DIAGNOSIS — Z87891 Personal history of nicotine dependence: Secondary | ICD-10-CM | POA: Diagnosis not present

## 2019-07-16 DIAGNOSIS — R918 Other nonspecific abnormal finding of lung field: Secondary | ICD-10-CM | POA: Diagnosis not present

## 2019-07-21 DIAGNOSIS — R911 Solitary pulmonary nodule: Secondary | ICD-10-CM | POA: Diagnosis not present

## 2019-07-21 DIAGNOSIS — R918 Other nonspecific abnormal finding of lung field: Secondary | ICD-10-CM | POA: Diagnosis not present

## 2019-07-28 DIAGNOSIS — Z87891 Personal history of nicotine dependence: Secondary | ICD-10-CM | POA: Diagnosis not present

## 2019-07-28 DIAGNOSIS — D492 Neoplasm of unspecified behavior of bone, soft tissue, and skin: Secondary | ICD-10-CM | POA: Diagnosis not present

## 2019-07-28 DIAGNOSIS — R918 Other nonspecific abnormal finding of lung field: Secondary | ICD-10-CM | POA: Diagnosis not present

## 2019-07-28 DIAGNOSIS — R0600 Dyspnea, unspecified: Secondary | ICD-10-CM | POA: Diagnosis not present

## 2019-07-30 DIAGNOSIS — H3554 Dystrophies primarily involving the retinal pigment epithelium: Secondary | ICD-10-CM | POA: Diagnosis not present

## 2019-07-30 DIAGNOSIS — H35441 Age-related reticular degeneration of retina, right eye: Secondary | ICD-10-CM | POA: Diagnosis not present

## 2019-07-30 DIAGNOSIS — H43811 Vitreous degeneration, right eye: Secondary | ICD-10-CM | POA: Diagnosis not present

## 2019-07-30 DIAGNOSIS — H268 Other specified cataract: Secondary | ICD-10-CM | POA: Diagnosis not present

## 2019-07-30 DIAGNOSIS — H5202 Hypermetropia, left eye: Secondary | ICD-10-CM | POA: Diagnosis not present

## 2019-07-30 DIAGNOSIS — H04123 Dry eye syndrome of bilateral lacrimal glands: Secondary | ICD-10-CM | POA: Diagnosis not present

## 2019-07-30 DIAGNOSIS — H2513 Age-related nuclear cataract, bilateral: Secondary | ICD-10-CM | POA: Diagnosis not present

## 2019-07-30 DIAGNOSIS — H524 Presbyopia: Secondary | ICD-10-CM | POA: Diagnosis not present

## 2019-07-30 DIAGNOSIS — H401131 Primary open-angle glaucoma, bilateral, mild stage: Secondary | ICD-10-CM | POA: Diagnosis not present

## 2019-07-30 DIAGNOSIS — Z83511 Family history of glaucoma: Secondary | ICD-10-CM | POA: Diagnosis not present

## 2019-07-30 DIAGNOSIS — H52203 Unspecified astigmatism, bilateral: Secondary | ICD-10-CM | POA: Diagnosis not present

## 2019-08-19 DIAGNOSIS — D492 Neoplasm of unspecified behavior of bone, soft tissue, and skin: Secondary | ICD-10-CM | POA: Diagnosis not present

## 2019-08-19 DIAGNOSIS — Z87891 Personal history of nicotine dependence: Secondary | ICD-10-CM | POA: Diagnosis not present

## 2019-08-19 DIAGNOSIS — R918 Other nonspecific abnormal finding of lung field: Secondary | ICD-10-CM | POA: Diagnosis not present

## 2019-09-02 DIAGNOSIS — N289 Disorder of kidney and ureter, unspecified: Secondary | ICD-10-CM | POA: Diagnosis not present

## 2019-09-02 DIAGNOSIS — E0590 Thyrotoxicosis, unspecified without thyrotoxic crisis or storm: Secondary | ICD-10-CM | POA: Diagnosis not present

## 2019-09-28 DIAGNOSIS — R7989 Other specified abnormal findings of blood chemistry: Secondary | ICD-10-CM | POA: Diagnosis not present

## 2019-09-28 DIAGNOSIS — N1831 Chronic kidney disease, stage 3a: Secondary | ICD-10-CM | POA: Diagnosis not present

## 2019-09-28 DIAGNOSIS — E559 Vitamin D deficiency, unspecified: Secondary | ICD-10-CM | POA: Diagnosis not present

## 2019-09-30 DIAGNOSIS — D492 Neoplasm of unspecified behavior of bone, soft tissue, and skin: Secondary | ICD-10-CM | POA: Diagnosis not present

## 2019-09-30 DIAGNOSIS — Z87891 Personal history of nicotine dependence: Secondary | ICD-10-CM | POA: Diagnosis not present

## 2019-09-30 DIAGNOSIS — D382 Neoplasm of uncertain behavior of pleura: Secondary | ICD-10-CM | POA: Diagnosis not present

## 2020-01-26 DIAGNOSIS — N816 Rectocele: Secondary | ICD-10-CM | POA: Diagnosis not present

## 2020-01-31 DIAGNOSIS — H2513 Age-related nuclear cataract, bilateral: Secondary | ICD-10-CM | POA: Diagnosis not present

## 2020-01-31 DIAGNOSIS — H04123 Dry eye syndrome of bilateral lacrimal glands: Secondary | ICD-10-CM | POA: Diagnosis not present

## 2020-01-31 DIAGNOSIS — H401131 Primary open-angle glaucoma, bilateral, mild stage: Secondary | ICD-10-CM | POA: Diagnosis not present

## 2020-01-31 DIAGNOSIS — H0100A Unspecified blepharitis right eye, upper and lower eyelids: Secondary | ICD-10-CM | POA: Diagnosis not present

## 2020-01-31 DIAGNOSIS — H268 Other specified cataract: Secondary | ICD-10-CM | POA: Diagnosis not present

## 2020-01-31 DIAGNOSIS — Z83511 Family history of glaucoma: Secondary | ICD-10-CM | POA: Diagnosis not present

## 2020-01-31 DIAGNOSIS — H0100B Unspecified blepharitis left eye, upper and lower eyelids: Secondary | ICD-10-CM | POA: Diagnosis not present

## 2020-02-11 DIAGNOSIS — N261 Atrophy of kidney (terminal): Secondary | ICD-10-CM | POA: Diagnosis not present

## 2020-02-11 DIAGNOSIS — N1831 Chronic kidney disease, stage 3a: Secondary | ICD-10-CM | POA: Diagnosis not present

## 2020-02-11 DIAGNOSIS — N200 Calculus of kidney: Secondary | ICD-10-CM | POA: Diagnosis not present

## 2020-03-09 DIAGNOSIS — J069 Acute upper respiratory infection, unspecified: Secondary | ICD-10-CM | POA: Diagnosis not present

## 2020-03-21 DIAGNOSIS — N1831 Chronic kidney disease, stage 3a: Secondary | ICD-10-CM | POA: Diagnosis not present

## 2020-03-27 DIAGNOSIS — E559 Vitamin D deficiency, unspecified: Secondary | ICD-10-CM | POA: Diagnosis not present

## 2020-03-27 DIAGNOSIS — M9080 Osteopathy in diseases classified elsewhere, unspecified site: Secondary | ICD-10-CM | POA: Diagnosis not present

## 2020-03-27 DIAGNOSIS — N200 Calculus of kidney: Secondary | ICD-10-CM | POA: Diagnosis not present

## 2020-03-27 DIAGNOSIS — E889 Metabolic disorder, unspecified: Secondary | ICD-10-CM | POA: Diagnosis not present

## 2020-03-27 DIAGNOSIS — R7989 Other specified abnormal findings of blood chemistry: Secondary | ICD-10-CM | POA: Diagnosis not present

## 2020-04-17 DIAGNOSIS — N183 Chronic kidney disease, stage 3 unspecified: Secondary | ICD-10-CM | POA: Diagnosis not present

## 2020-04-17 DIAGNOSIS — R739 Hyperglycemia, unspecified: Secondary | ICD-10-CM | POA: Diagnosis not present

## 2020-04-17 DIAGNOSIS — Z79899 Other long term (current) drug therapy: Secondary | ICD-10-CM | POA: Diagnosis not present

## 2020-04-17 DIAGNOSIS — E785 Hyperlipidemia, unspecified: Secondary | ICD-10-CM | POA: Diagnosis not present

## 2020-04-17 DIAGNOSIS — N816 Rectocele: Secondary | ICD-10-CM | POA: Diagnosis not present

## 2020-04-17 DIAGNOSIS — E039 Hypothyroidism, unspecified: Secondary | ICD-10-CM | POA: Diagnosis not present

## 2020-05-04 NOTE — Progress Notes (Signed)
[name] New Patient Evaluation and Consultation  Referring Provider: Robyne Peers, MD PCP: Robyne Peers, MD Date of Service: 05/10/2020  SUBJECTIVE Chief Complaint: New Patient (Initial Visit) (Rectocele; states that she doesn't feel that she is emptying bladder and has pain at end of stream)  History of Present Illness: Brianna [name] is a 75 y.o. White or Caucasian female presenting for evaluation of prolapse.     Urinary Symptoms: Leaks urine with with urgency Leaks 1 time(s) per day.  Pad use: 1 adult diapers per day.   She is bothered by her UI symptoms.  Day time voids 10-12.  Nocturia: 3-4 times per night to void. Voiding dysfunction: she does not empty her bladder well.  does not use a catheter to empty bladder.  When urinating, she feels she has no difficulties but has pain at the end of urination Had a sling placed in 1984.  Drinks: 2 cups coffee, 25-30oz water per day,  occasional wine cooler or tea Does not drink up until bedtime Has some lower leg swelling sometimes, does not use compression socks.   UTIs: 0 UTI's in the last year.   Reports history of kidney or bladder stones- has stage III kidney disease and had an ultrasound that showed some stones  Pelvic Organ Prolapse Symptoms:                  She Admits to a feeling of a bulge the vaginal area. It has been present for 2 years.  She Denies seeing a bulge.  This bulge is bothersome- feels a lot of pressure.  Has tried a pessary in the past, a few years ago.   Bowel Symptom: Bowel movements: 4 time(s) per day Stool consistency: loose Straining: no.  Splinting: no.  Incomplete evacuation: no.  She admits to accidental bowel leakage / fecal incontinence- has to stay away from dairy and is able to control at this time Bowel regimen: none  Had a history of constipation several years ago due to medication Last colonoscopy: Has to do Cologuard test  Sexual Function Sexually  active: no.    Pelvic Pain Denies pelvic pain   Past Medical History:  Past Medical History:  Diagnosis Date   Allergy    Anemia    Attention deficit disorder with [id number](409.73)    Complication of anesthesia    shaking after sugery in the 1980's   Dementia ([place])    Depressive disorder, not elsewhere classified    Diabetes mellitus without complication ([place])    lost weight and no longer has the issue with blood sugars   Dysrhythmia    Esophageal reflux    GERD (gastroesophageal reflux disease)    HLD (hyperlipidemia)    Hyperthyroidism    per patient   Insomnia    Kidney disease    stage 3 per pt   Kidney stones    Lung tumor 2018   Migraine    Neuromuscular disorder ([place])    Obstructive sleep apnea    Osteoarthritis    Osteoporosis    Other and unspecified hyperlipidemia    Other chronic cystitis    Panic attacks    Posttraumatic stress disorder    followed by Dr Lovena Le, psychiatry   Sleep apnea    Stricture and stenosis of esophagus    Temporomandibular joint disorders, unspecified    Thoracic spondylosis without myelopathy    Unspecified hypothyroidism      Past Surgical History:   Past Surgical  History:  Procedure Laterality Date   ABDOMINAL HYSTERECTOMY     APPENDECTOMY     CHOLECYSTECTOMY     COLONOSCOPY     CYSTORRHAPHY     "bladder tack"   EYE SURGERY Right    LUMBAR LAMINECTOMY/DECOMPRESSION MICRODISCECTOMY Right 01/28/2013   Procedure: LUMBAR LAMINECTOMY/DECOMPRESSION MICRODISCECTOMY LUMBAR FOUR-FIVE;  Surgeon: Reinaldo Meeker, MD;  Location: [facility];  Service: Neurosurgery;  Laterality: Right;   TONSILLECTOMY     TRANSFORAMINAL LUMBAR INTERBODY FUSION (TLIF) WITH PEDICLE SCREW FIXATION 1 LEVEL Right 11/25/2013   Procedure: TRANSFORAMINAL LUMBAR INTERBODY FUSION (TLIF) WITH PEDICLE SCREW FIXATION 1 LEVEL;  Surgeon: Reinaldo Meeker, MD;  Location: [facility];  Service: Neurosurgery;  Laterality:  Right;  TRANSFORAMINAL LUMBAR INTERBODY FUSION (TLIF) WITH PEDICLE SCREW FIXATION 1 LEVEL LUMBAR 4-5   TUBAL LIGATION       Past OB/GYN History: G6 P4 Vaginal deliveries: 4,  Forceps/ Vacuum deliveries: 0, Cesarean section: 0 Menopausal: Yes, Denies vaginal bleeding since menopause Last pap smear was several years ago.  Any history of abnormal pap smears: no.   Medications: She has a current medication list which includes the following prescription(s): aspirin, atorvastatin, levothyroxine, OVER THE COUNTER MEDICATION, and timolol.   Allergies: Patient is allergic to tetracycline and lactose intolerance (gi).   Social History:  Social History   Tobacco Use   Smoking status: Former Smoker    Packs/day: 0.50    Years: 20.00    Pack years: 10.00    Types: Cigarettes   Smokeless tobacco: Never Used  Building services engineer Use: Never used  Substance Use Topics   Alcohol use: No    Alcohol/week: 0.0 standard drinks   Drug use: No    Relationship status: widowed She lives alone.   She is not employed. Regular exercise: No- has not been able to because of the prolapse.  Family History:   Family History  Problem Relation Age of Onset   Heart attack Father    Heart failure Father    Liver disease Father    Heart disease Father    COPD Mother    Depression Mother    Diabetes Mother    Crohn's disease Son    Colon cancer Neg Hx    Colon polyps Neg Hx    Esophageal cancer Neg Hx    Rectal cancer Neg Hx    Stomach cancer Neg Hx      Review of Systems: Review of Systems  Constitutional: Negative for fever, malaise/fatigue and weight loss.  Respiratory: Negative for cough, shortness of breath and wheezing.   Cardiovascular: Positive for leg swelling. Negative for chest pain and palpitations.  Gastrointestinal: Negative for abdominal pain and blood in stool.  Genitourinary: Negative for dysuria.  Musculoskeletal: Negative for myalgias.  Skin: Negative  for rash.  Neurological: Negative for dizziness and headaches.  Endo/Heme/Allergies: Does not bruise/bleed easily.  Psychiatric/Behavioral: Negative for depression. The patient is not nervous/anxious.      OBJECTIVE Physical Exam: Vitals:   05/10/20 1024  BP: 129/81  Pulse: 80  Weight: 130 lb (59 kg)  Height: 5\' 1"  (1.549 m)    Physical Exam Constitutional:      General: She is not in acute distress. Pulmonary:     Effort: Pulmonary effort is normal.  Abdominal:     General: There is no distension.     Palpations: Abdomen is soft.     Tenderness: There is no abdominal tenderness. There is no rebound.  Comments: Low transverse incision present.   Musculoskeletal:        General: No swelling. Normal range of motion.  Skin:    General: Skin is warm and dry.     Findings: No rash.  Neurological:     Mental Status: She is alert and oriented to person, place, and time.  Psychiatric:        Mood and Affect: Mood normal.        Behavior: Behavior normal.     GU / Detailed Urogynecologic Evaluation:  Pelvic Exam: Normal external female genitalia; Bartholin's and Skene's glands normal in appearance; urethral meatus normal in appearance, no urethral masses or discharge.   CST: negative  s/p hysterectomy: Speculum exam reveals normal vaginal mucosa with  atrophy and normal vaginal cuff.  Adnexa no mass, fullness, tenderness.     Pelvic floor strength II/V, puborectalis III/V external anal sphincter III/V  Pelvic floor musculature: Right levator non-tender, Right obturator non-tender, Left levator non-tender, Left obturator non-tender  POP-Q:   POP-Q  -2                                            Aa   -2                                           Ba  -3                                              C   4                                            Gh  3                                            Pb  7                                            tvl   3                                             Ap  3                                            Bp                                                 D  Rectal Exam:  Normal sphincter tone, large distal rectocele, enterocoele not present, no rectal masses  Post-Void Residual (PVR) by Bladder Scan: In order to evaluate bladder emptying, we discussed obtaining a postvoid residual and she agreed to this procedure.  Procedure: The ultrasound unit was placed on the patient's abdomen in the suprapubic region after the patient had voided. A PVR of 12 ml was obtained by bladder scan.  Laboratory Results: POC urine: + protein, nitrites, small blood  I visualized the urine specimen, noting the specimen to be dark yellow  ASSESSMENT AND PLAN Ms. Delahoussaye is a 75 y.o. with:  1. Prolapse of posterior vaginal wall   2. Vaginal vault prolapse after hysterectomy   3. Nocturia   4. Urinary frequency   5. Hematuria, unspecified type   6. Dysuria    1. Stage I anterior, Stage III posterior, Stage I apical prolapse For treatment of pelvic organ prolapse, we discussed options for management including expectant management, conservative management, and surgical management, such as Kegels, a pessary, pelvic floor physical therapy, and specific surgical procedures. - She would like to avoid surgery at this time (has another surgery for lung resection in the near future), so is interested in trying a pessary again. Will schedule for pessary fitting.   2. Nocturia - Advised to wear compression stockings during the day to see if this helps with leg swelling and avoiding large fluid shifts at night.  - She can also consider a medication for overactive bladder.   3. Urinary frequency/ dysuria/ hematuria - May have a component of overactive bladder - POC urine concerning for urinary tract infection. Will send for micro UA and urine culture today and will treat if necessary.   Return for pessary fitting  Jaquita Folds, MD   Time spent: I spent 50 minutes dedicated to the care of this patient on the date of this encounter to include pre-visit review of records, face-to-face time with the patient and post visit documentation and ordering medication/ testing.

## 2020-05-09 ENCOUNTER — Telehealth: Payer: Self-pay

## 2020-05-09 NOTE — Telephone Encounter (Signed)
Attempt made to contact the patient re: medication and chart review. LM on the VM for the patient to call back

## 2020-05-10 ENCOUNTER — Other Ambulatory Visit: Payer: Self-pay

## 2020-05-10 ENCOUNTER — Ambulatory Visit (INDEPENDENT_AMBULATORY_CARE_PROVIDER_SITE_OTHER): Payer: PPO | Admitting: Obstetrics and Gynecology

## 2020-05-10 ENCOUNTER — Encounter: Payer: Self-pay | Admitting: Obstetrics and Gynecology

## 2020-05-10 VITALS — BP 129/81 | HR 80 | Ht 61.0 in | Wt 130.0 lb

## 2020-05-10 DIAGNOSIS — N993 Prolapse of vaginal vault after hysterectomy: Secondary | ICD-10-CM | POA: Diagnosis not present

## 2020-05-10 DIAGNOSIS — N816 Rectocele: Secondary | ICD-10-CM | POA: Diagnosis not present

## 2020-05-10 DIAGNOSIS — R351 Nocturia: Secondary | ICD-10-CM | POA: Diagnosis not present

## 2020-05-10 DIAGNOSIS — R319 Hematuria, unspecified: Secondary | ICD-10-CM | POA: Diagnosis not present

## 2020-05-10 DIAGNOSIS — R300 Dysuria: Secondary | ICD-10-CM | POA: Diagnosis not present

## 2020-05-10 DIAGNOSIS — R350 Frequency of micturition: Secondary | ICD-10-CM | POA: Diagnosis not present

## 2020-05-10 LAB — POCT URINALYSIS DIPSTICK
Appearance: NORMAL
Bilirubin, UA: NEGATIVE
Glucose, UA: NEGATIVE
Leukocytes, UA: NEGATIVE
Nitrite, UA: POSITIVE
Protein, UA: POSITIVE — AB
Spec Grav, UA: 1.025 (ref 1.010–1.025)
Urobilinogen, UA: 0.2 E.U./dL
pH, UA: 5 (ref 5.0–8.0)

## 2020-05-10 NOTE — Patient Instructions (Signed)
You have a stage 3 (out of 4) prolapse.  We discussed the fact that it is not life threatening but there are several treatment options. For treatment of pelvic organ prolapse, we discussed options for management including expectant management, conservative management, and surgical management, such as Kegels, a pessary, pelvic floor physical therapy, and specific surgical procedures.     You will return for a pessary fitting.   For your urinating at night, try using compression stockings during the day. Medication is also an option to try.

## 2020-05-14 LAB — URINE CULTURE

## 2020-05-15 ENCOUNTER — Telehealth: Payer: Self-pay | Admitting: Obstetrics and Gynecology

## 2020-05-15 DIAGNOSIS — N3000 Acute cystitis without hematuria: Secondary | ICD-10-CM

## 2020-05-15 MED ORDER — NITROFURANTOIN MONOHYD MACRO 100 MG PO CAPS: 100.0000 mg | ORAL_CAPSULE | Freq: Two times a day (BID) | ORAL | 0 refills | Status: AC

## 2020-05-15 NOTE — Telephone Encounter (Signed)
Nitrofurantoin sent to pharmacy for UTI.

## 2020-05-15 NOTE — Telephone Encounter (Signed)
DOB verified. Informed pt that urine culture came back positive for a UTI and that macrobid had been sent to her pharmacy. Advised to take twice daily for 5 days. Pt verbalized understanding.

## 2020-05-15 NOTE — Progress Notes (Signed)
[facility]   Subjective:     Chief Complaint: Pessary Fitting  History of Present Illness: Brianna [name] is a 75 y.o. female with stage III pelvic organ prolapse and nocturia who presents today for a pessary fitting.   At last visit was diagnosed with a urinary tract infection. She was started on macrobid BID x 5 days. Has noticed improvement already with burning with urination. Has also been using compression stockings during the day to see if this helps some of her nighttime urinary symptoms.   Past Medical History: Brianna [name]  has a past medical history of Allergy, Anemia, Attention deficit disorder with [id number](932.67), Complication of anesthesia, Dementia ([place]), Depressive disorder, not elsewhere classified, Diabetes mellitus without complication ([place]), Dysrhythmia, Esophageal reflux, GERD (gastroesophageal reflux disease), HLD (hyperlipidemia), Hyperthyroidism, Insomnia, Kidney disease, Kidney stones, Lung tumor (2018), Migraine, Neuromuscular disorder ([place]), Obstructive sleep apnea, Osteoarthritis, Osteoporosis, Other and unspecified hyperlipidemia, Other chronic cystitis, Panic attacks, Posttraumatic stress disorder, Sleep apnea, Stricture and stenosis of esophagus, Temporomandibular joint disorders, unspecified, Thoracic spondylosis without myelopathy, and Unspecified hypothyroidism.   Past Surgical History: She  has a past surgical history that includes Appendectomy; Cholecystectomy; Tubal ligation; Eye surgery (Right); Abdominal hysterectomy; Tonsillectomy; Colonoscopy; Lumbar laminectomy/decompression microdiscectomy (Right, 01/28/2013); Transforaminal lumbar interbody fusion (tlif) with pedicle screw fixation 1 level (Right, 11/25/2013); and Cystorrhaphy.   Medications: She has a current medication list which includes the following prescription(s): aspirin, atorvastatin, levothyroxine, nitrofurantoin (macrocrystal-monohydrate), OVER THE COUNTER MEDICATION, and  timolol.   Allergies: Brianna [name] is allergic to tetracycline and lactose intolerance (gi).   Social History: Brianna [name]  reports that she has quit smoking. Her smoking use included cigarettes. She has a 10.00 pack-year smoking history. She has never used smokeless tobacco. She reports that she does not drink alcohol and does not use drugs.      Objective:    BP (!) 161/74   Pulse 71   Ht 5\' 1"  (1.549 m)   Wt 130 lb (59 kg)   BMI 24.56 kg/m  Gen: No apparent distress, A&O x 3. Pelvic Exam: Normal external female genitalia; Bartholin's and Skene's glands normal in appearance; urethral meatus normal in appearance, no urethral masses or discharge.   A size 3in ring with support pessary was fitted- this was uncomfortable for her and removed. A size 2-1/2 in ring with support pessary was placed, Lot #[id number], Exp 10/20/24 . It was comfortable, stayed in place with valsalva, walking and bending and was an appropriate size on examination, with one finger fitting between the pessary and the vaginal walls.   POP-Q (05/10/20):   POP-Q  -2                                            Aa   -2                                           Ba  -3                                              C   4  Gh  3                                            Pb  7                                            tvl   3                                            Ap  3                                            Bp                                                 D     Assessment/Plan:    Assessment: Brianna [name] is a 75 y.o. with stage II pelvic organ prolapse, nocturia and recent UTI   Plan: - Complete macrobid course, has seen improvement of dysuria - Continue to use compression socks during the day to reduce LE swelling to see if this helps her nocturia. Can consider medication at next visit if not improved.  - She was fitted with a 2-1/2 ring with support  pessary. She will keep the pessary in place until next visit.   Follow-up in 1 month for a pessary check or sooner as needed.  All questions were answered.    Jaquita Folds, MD  Time spent: I spent 15 minutes dedicated to the care of this Brianna [name] on the date of this encounter to include pre-visit review of records, face-to-face time with the Brianna [name] and post visit documentation.  Additional time was spent for pessary fitting.

## 2020-05-18 DIAGNOSIS — Z1212 Encounter for screening for malignant neoplasm of rectum: Secondary | ICD-10-CM | POA: Diagnosis not present

## 2020-05-18 DIAGNOSIS — Z1211 Encounter for screening for malignant neoplasm of colon: Secondary | ICD-10-CM | POA: Diagnosis not present

## 2020-05-19 ENCOUNTER — Other Ambulatory Visit: Payer: Self-pay

## 2020-05-19 ENCOUNTER — Encounter: Payer: Self-pay | Admitting: Obstetrics and Gynecology

## 2020-05-19 ENCOUNTER — Ambulatory Visit (INDEPENDENT_AMBULATORY_CARE_PROVIDER_SITE_OTHER): Payer: PPO | Admitting: Obstetrics and Gynecology

## 2020-05-19 VITALS — BP 161/74 | HR 71 | Ht 61.0 in | Wt 130.0 lb

## 2020-05-19 DIAGNOSIS — N3000 Acute cystitis without hematuria: Secondary | ICD-10-CM | POA: Diagnosis not present

## 2020-05-19 DIAGNOSIS — N816 Rectocele: Secondary | ICD-10-CM | POA: Diagnosis not present

## 2020-05-19 DIAGNOSIS — R351 Nocturia: Secondary | ICD-10-CM

## 2020-05-19 NOTE — Patient Instructions (Signed)
You were fit with a 2-1/2 in ring with support pessary. You are electing to leave it in place until your follow up visit. If it falls out you can try to put it back in or you can leave it out. If it seems to be slipping down you can use your finger to push it back in deeper - the deeper it is, the better fit.

## 2020-05-25 LAB — COLOGUARD: COLOGUARD: NEGATIVE

## 2020-06-06 ENCOUNTER — Other Ambulatory Visit: Payer: Self-pay

## 2020-06-06 ENCOUNTER — Ambulatory Visit (INDEPENDENT_AMBULATORY_CARE_PROVIDER_SITE_OTHER): Payer: PPO | Admitting: Obstetrics and Gynecology

## 2020-06-06 ENCOUNTER — Encounter: Payer: Self-pay | Admitting: Obstetrics and Gynecology

## 2020-06-06 VITALS — BP 142/83 | HR 60 | Ht 61.0 in | Wt 128.0 lb

## 2020-06-06 DIAGNOSIS — N816 Rectocele: Secondary | ICD-10-CM

## 2020-06-06 NOTE — Progress Notes (Signed)
Brianna [name] is a 75 y.o. female complains of back pain and rectal pressure the past 10 days. Pt said she believes her body is rejecting the pessary.

## 2020-06-06 NOTE — Progress Notes (Signed)
[facility]   Subjective:     Chief Complaint:  Chief Complaint  Patient presents with   Pessary Follow up   History of Present Illness: Brianna [name] is a 75 y.o. female with stage III pelvic organ prolapse who presents for a pessary check. She is using a size 2-1/2 ring with support pessary. Up until 10 days ago, the pessary was working well. But recently, has been uncomfortable. She denies vaginal bleeding.  Having pressure in the rectal area. Bowel movements have been daily, not straining. Has also been having some backache and chills, denies fever.   Past Medical History: Patient  has a past medical history of Allergy, Anemia, Attention deficit disorder with [id number](237.62), Complication of anesthesia, Dementia ([place]), Depressive disorder, not elsewhere classified, Diabetes mellitus without complication ([place]), Dysrhythmia, Esophageal reflux, GERD (gastroesophageal reflux disease), HLD (hyperlipidemia), Hyperthyroidism, Insomnia, Kidney disease, Kidney stones, Lung tumor (2018), Migraine, Neuromuscular disorder ([place]), Obstructive sleep apnea, Osteoarthritis, Osteoporosis, Other and unspecified hyperlipidemia, Other chronic cystitis, Panic attacks, Posttraumatic stress disorder, Sleep apnea, Stricture and stenosis of esophagus, Temporomandibular joint disorders, unspecified, Thoracic spondylosis without myelopathy, and Unspecified hypothyroidism.   Past Surgical History: She  has a past surgical history that includes Appendectomy; Cholecystectomy; Tubal ligation; Eye surgery (Right); Abdominal hysterectomy; Tonsillectomy; Colonoscopy; Lumbar laminectomy/decompression microdiscectomy (Right, 01/28/2013); Transforaminal lumbar interbody fusion (tlif) with pedicle screw fixation 1 level (Right, 11/25/2013); and Cystorrhaphy.   Medications: She has a current medication list which includes the following prescription(s): aspirin, atorvastatin, levothyroxine, OVER THE COUNTER  MEDICATION, and timolol.   Allergies: Patient is allergic to tetracycline and lactose intolerance (gi).   Social History: Patient  reports that she has quit smoking. Her smoking use included cigarettes. She has a 10.00 pack-year smoking history. She has never used smokeless tobacco. She reports that she does not drink alcohol and does not use drugs.      Objective:    Physical Exam: BP (!) 142/83    Pulse 60    Ht 5\' 1"  (1.549 m)    Wt 128 lb (58.1 kg)    BMI 24.19 kg/m  Gen: No apparent distress, A&O x 3. Detailed Urogynecologic Evaluation:  Pelvic Exam: Normal external female genitalia; Bartholin's and Skene's glands normal in appearance; urethral meatus normal in appearance, no urethral masses or discharge. The pessary was noted to be in place. It was removed and cleaned. Speculum exam revealed no lesions in the vagina. The pessary was kept out and given to the patient. No pelvic floor muscle tenderness on digital exam.   POP-Q (05/10/20):   POP-Q  -2                                            Aa   -2                                           Ba  -3                                              C   4  Gh  3                                            Pb  7                                            tvl   3                                            Ap  3                                            Bp                                                 D       Assessment/Plan:    Assessment: Brianna [name] is a 75 y.o. with stage III pelvic organ prolapse here for a pessary check. She is doing well.  Plan: - She has decided to do a trial without the pessary and will return in two weeks and decide if she wants it replaced.  - She also became dizzy during the exam and moving to the table. Encouraged follow up with PCP regarding this symptom as well as her back pain and dizziness as this is unlikely to be related to her  pessary.   Return 2 weeks All questions were answered.  Jaquita Folds, MD    Time spent: I spent 20 minutes dedicated to the care of this patient on the date of this encounter to include pre-visit review of records, face-to-face time with the patient and post visit documentation.

## 2020-06-08 DIAGNOSIS — R911 Solitary pulmonary nodule: Secondary | ICD-10-CM | POA: Diagnosis not present

## 2020-06-08 DIAGNOSIS — R222 Localized swelling, mass and lump, trunk: Secondary | ICD-10-CM | POA: Diagnosis not present

## 2020-06-14 DIAGNOSIS — I700 Atherosclerosis of aorta: Secondary | ICD-10-CM | POA: Diagnosis not present

## 2020-06-14 DIAGNOSIS — D492 Neoplasm of unspecified behavior of bone, soft tissue, and skin: Secondary | ICD-10-CM | POA: Diagnosis not present

## 2020-06-14 DIAGNOSIS — R918 Other nonspecific abnormal finding of lung field: Secondary | ICD-10-CM | POA: Diagnosis not present

## 2020-06-14 DIAGNOSIS — I2510 Atherosclerotic heart disease of native coronary artery without angina pectoris: Secondary | ICD-10-CM | POA: Diagnosis not present

## 2020-06-14 DIAGNOSIS — R911 Solitary pulmonary nodule: Secondary | ICD-10-CM | POA: Diagnosis not present

## 2020-06-21 NOTE — Progress Notes (Deleted)
[facility] Return Visit  SUBJECTIVE  History of Present Illness: Brianna [name] is a 75 y.o. female seen in follow-up for stage III POP and nocturia. Plan at last visit was to leave pessary out to see if her symptoms had improved. She was using a size 2-1/2 ring with support but felt a lot of rectal pressure.    Past Medical History: Patient  has a past medical history of Allergy, Anemia, Attention deficit disorder with [id number](881.10), Complication of anesthesia, Dementia ([place]), Depressive disorder, not elsewhere classified, Diabetes mellitus without complication ([place]), Dysrhythmia, Esophageal reflux, GERD (gastroesophageal reflux disease), HLD (hyperlipidemia), Hyperthyroidism, Insomnia, Kidney disease, Kidney stones, Lung tumor (2018), Migraine, Neuromuscular disorder ([place]), Obstructive sleep apnea, Osteoarthritis, Osteoporosis, Other and unspecified hyperlipidemia, Other chronic cystitis, Panic attacks, Posttraumatic stress disorder, Sleep apnea, Stricture and stenosis of esophagus, Temporomandibular joint disorders, unspecified, Thoracic spondylosis without myelopathy, and Unspecified hypothyroidism.   Past Surgical History: She  has a past surgical history that includes Appendectomy; Cholecystectomy; Tubal ligation; Eye surgery (Right); Abdominal hysterectomy; Tonsillectomy; Colonoscopy; Lumbar laminectomy/decompression microdiscectomy (Right, 01/28/2013); Transforaminal lumbar interbody fusion (tlif) with pedicle screw fixation 1 level (Right, 11/25/2013); and Cystorrhaphy.   Medications: She has a current medication list which includes the following prescription(s): aspirin, atorvastatin, levothyroxine, OVER THE COUNTER MEDICATION, and timolol.   Allergies: Patient is allergic to tetracycline and lactose intolerance (gi).   Social History: Patient  reports that she has quit smoking. Her smoking use included cigarettes. She has a 10.00 pack-year smoking history. She  has never used smokeless tobacco. She reports that she does not drink alcohol and does not use drugs.      OBJECTIVE     Physical Exam: There were no vitals filed for this visit. Gen: No apparent distress, A&O x 3.  Detailed Urogynecologic Evaluation:  Deferred. Prior exam showed:  No flowsheet data found.     ASSESSMENT AND PLAN    Brianna [name] is a 75 y.o. with:  No diagnosis found.

## 2020-06-23 ENCOUNTER — Ambulatory Visit: Payer: PPO | Admitting: Obstetrics and Gynecology

## 2020-07-26 ENCOUNTER — Encounter: Payer: Self-pay | Admitting: *Deleted

## 2020-07-26 ENCOUNTER — Telehealth: Payer: Self-pay | Admitting: *Deleted

## 2020-07-26 DIAGNOSIS — R351 Nocturia: Secondary | ICD-10-CM

## 2020-07-26 MED ORDER — MIRABEGRON ER 25 MG PO TB24: 25.0000 mg | ORAL_TABLET | Freq: Every day | ORAL | 5 refills | Status: DC

## 2020-07-26 NOTE — Telephone Encounter (Signed)
Has not been able to wear her compression socks as much because it of how warm it has been. So her legs have been swelling more. Still urinating a lot a night. Advised her to speak with her PCP about her leg swelling. She would like to try a medication for her nighttime urination. Prescribed Myrbetriq 25mg  daily. Will have her return in a few weeks to follow up.   Jaquita Folds, MD

## 2020-07-26 NOTE — Telephone Encounter (Signed)
Pt called stating that when she saw Dr. Wannetta Sender last, they talked about the swelling in her legs. She stated that Dr. Wannetta Sender recommended compression socks and offered to send in medication to help with the swelling. The pt states that she uses the compression socks but declined medication at the time. She states that her swelling is increasing even with use of the socks and she would now like to try medication. Advised that I would send her request to Dr. Wannetta Sender and she could check with her pharmacy later today. Pt verbalized understanding.

## 2020-07-26 NOTE — Addendum Note (Signed)
Addended by: Jaquita Folds on: 07/26/2020 04:15 PM   Modules accepted: Orders

## 2020-08-11 ENCOUNTER — Telehealth: Payer: Self-pay | Admitting: Obstetrics and Gynecology

## 2020-11-02 NOTE — Progress Notes (Signed)
[facility] Return Visit  SUBJECTIVE  History of Present Illness: Brianna [name] is a 75 y.o. female seen in follow-up for stage III POP and urinary urgency. Pessary was removed at last visit because it was uncomfortable. She was started on Myrbetriq '25mg'$ .   Never took the myrbetriq, does not like to take medication. Has been waking up several times per night. Has bad sleep apnea but does not use the CPAP because it makes her feel like she is choking.   Very uncomfortable with the prolapse, feels it with walking around. Does not want surgery at this time.   Past Medical History: Patient  has a past medical history of Allergy, Anemia, Attention deficit disorder with 0000000), Complication of anesthesia, Dementia ([place]), Depressive disorder, not elsewhere classified, Diabetes mellitus without complication ([place]), Dysrhythmia, Esophageal reflux, GERD (gastroesophageal reflux disease), HLD (hyperlipidemia), Hyperthyroidism, Insomnia, Kidney disease, Kidney stones, Lung tumor (2018), Migraine, Neuromuscular disorder ([place]), Obstructive sleep apnea, Osteoarthritis, Osteoporosis, Other and unspecified hyperlipidemia, Other chronic cystitis, Panic attacks, Posttraumatic stress disorder, Sleep apnea, Stricture and stenosis of esophagus, Temporomandibular joint disorders, unspecified, Thoracic spondylosis without myelopathy, and Unspecified hypothyroidism.   Past Surgical History: She  has a past surgical history that includes Appendectomy; Cholecystectomy; Tubal ligation; Eye surgery (Right); Abdominal hysterectomy; Tonsillectomy; Colonoscopy; Lumbar laminectomy/decompression microdiscectomy (Right, 01/28/2013); Transforaminal lumbar interbody fusion (tlif) with pedicle screw fixation 1 level (Right, 11/25/2013); and Cystorrhaphy.   Medications: She has a current medication list which includes the following prescription(s): aspirin, atorvastatin, levothyroxine, mirabegron er, OVER THE  COUNTER MEDICATION, and timolol.   Allergies: Patient is allergic to tetracycline and lactose intolerance (gi).   Social History: Patient  reports that she has quit smoking. Her smoking use included cigarettes. She has a 10.00 pack-year smoking history. She has never used smokeless tobacco. She reports that she does not drink alcohol and does not use drugs.      OBJECTIVE     Physical Exam: Vitals:   11/07/20 0916  BP: 126/76  Pulse: 69  Weight: 124 lb 6.4 oz (56.4 kg)   Gen: No apparent distress, A&O x 3.  Detailed Urogynecologic Evaluation:  Normal external genitalia.   Pessary fitting: A #2 (1-3/8in) cube pessary was placed. It was comfortable, fit well, and stayed in placed with valsalva, walking and bending.  Lot # [id number] Exp 06/17/24   POP-Q (05/10/20):   POP-Q   -2                                            Aa   -2                                           Ba   -3                                              C    4                                            Gh  3                                            Pb   7                                            tvl    3                                            Ap   3                                            Bp                                                  D       ASSESSMENT AND PLAN    [name] is a 75 y.o. with:  1. Nocturia   2. Prolapse of posterior vaginal wall    Nocturia - We reviewed that untreated sleep apnea can affect urine production at night - She will start Myrbetriq '25mg'$  as she already has the medication at home  2. Prolapse - Opted to try another pessary. She was fit with a #2 cube. She will notify us if it is not working for her or if she has issues.   Follow up 3 months  Jaquita Folds, MD  Time spent: I spent 25 minutes dedicated to the care of this patient on the date of this encounter to include pre-visit review of records, face-to-face time with the patient and  post visit documentation.

## 2020-11-07 ENCOUNTER — Other Ambulatory Visit: Payer: Self-pay

## 2020-11-07 ENCOUNTER — Ambulatory Visit (INDEPENDENT_AMBULATORY_CARE_PROVIDER_SITE_OTHER): Payer: PPO | Admitting: Obstetrics and Gynecology

## 2020-11-07 ENCOUNTER — Encounter: Payer: Self-pay | Admitting: Obstetrics and Gynecology

## 2020-11-07 VITALS — BP 126/76 | HR 69 | Wt 124.4 lb

## 2020-11-07 DIAGNOSIS — N816 Rectocele: Secondary | ICD-10-CM

## 2020-11-07 DIAGNOSIS — R351 Nocturia: Secondary | ICD-10-CM | POA: Diagnosis not present

## 2020-11-07 NOTE — Patient Instructions (Signed)
You were fit with a cube pessary. You are electing to leave it in place until your follow up visit. If it falls out you can try to put it back in or you can leave it out. If it seems to be slipping down you can use your finger to push it back in deeper - the deeper it is, the better fit.

## 2020-11-15 DIAGNOSIS — H401131 Primary open-angle glaucoma, bilateral, mild stage: Secondary | ICD-10-CM | POA: Diagnosis not present

## 2020-11-15 DIAGNOSIS — H0100A Unspecified blepharitis right eye, upper and lower eyelids: Secondary | ICD-10-CM | POA: Diagnosis not present

## 2020-11-15 DIAGNOSIS — H0100B Unspecified blepharitis left eye, upper and lower eyelids: Secondary | ICD-10-CM | POA: Diagnosis not present

## 2020-11-15 DIAGNOSIS — H43811 Vitreous degeneration, right eye: Secondary | ICD-10-CM | POA: Diagnosis not present

## 2020-11-15 DIAGNOSIS — H52203 Unspecified astigmatism, bilateral: Secondary | ICD-10-CM | POA: Diagnosis not present

## 2020-11-15 DIAGNOSIS — H524 Presbyopia: Secondary | ICD-10-CM | POA: Diagnosis not present

## 2020-11-15 DIAGNOSIS — H04123 Dry eye syndrome of bilateral lacrimal glands: Secondary | ICD-10-CM | POA: Diagnosis not present

## 2020-11-15 DIAGNOSIS — H268 Other specified cataract: Secondary | ICD-10-CM | POA: Diagnosis not present

## 2020-11-15 DIAGNOSIS — H3589 Other specified retinal disorders: Secondary | ICD-10-CM | POA: Diagnosis not present

## 2020-11-15 DIAGNOSIS — H5213 Myopia, bilateral: Secondary | ICD-10-CM | POA: Diagnosis not present

## 2020-11-15 DIAGNOSIS — H2513 Age-related nuclear cataract, bilateral: Secondary | ICD-10-CM | POA: Diagnosis not present

## 2020-12-19 ENCOUNTER — Telehealth: Payer: Self-pay | Admitting: Obstetrics and Gynecology

## 2021-01-16 DIAGNOSIS — H25811 Combined forms of age-related cataract, right eye: Secondary | ICD-10-CM | POA: Diagnosis not present

## 2021-01-16 DIAGNOSIS — H2511 Age-related nuclear cataract, right eye: Secondary | ICD-10-CM | POA: Diagnosis not present

## 2021-01-17 NOTE — Telephone Encounter (Signed)
completed

## 2021-02-07 ENCOUNTER — Ambulatory Visit: Payer: PPO | Admitting: Obstetrics and Gynecology

## 2021-03-14 DIAGNOSIS — H35351 Cystoid macular degeneration, right eye: Secondary | ICD-10-CM | POA: Diagnosis not present

## 2021-05-14 NOTE — Telephone Encounter (Signed)
Reviewed

## 2021-11-06 ENCOUNTER — Encounter: Payer: Self-pay | Admitting: Obstetrics and Gynecology

## 2021-11-06 ENCOUNTER — Ambulatory Visit (INDEPENDENT_AMBULATORY_CARE_PROVIDER_SITE_OTHER): Payer: PPO | Admitting: Obstetrics and Gynecology

## 2021-11-06 VITALS — BP 120/74 | HR 65

## 2021-11-06 DIAGNOSIS — N816 Rectocele: Secondary | ICD-10-CM | POA: Diagnosis not present

## 2021-11-06 DIAGNOSIS — N993 Prolapse of vaginal vault after hysterectomy: Secondary | ICD-10-CM | POA: Diagnosis not present

## 2021-11-06 DIAGNOSIS — N393 Stress incontinence (female) (male): Secondary | ICD-10-CM

## 2021-11-06 DIAGNOSIS — N8110 Cystocele, unspecified: Secondary | ICD-10-CM

## 2021-11-06 NOTE — Patient Instructions (Signed)

## 2021-11-06 NOTE — Progress Notes (Signed)
[facility] Return Visit  SUBJECTIVE  History of Present Illness: Brianna [name] is a 76 y.o. female seen in follow-up for stage III POP and urinary urgency.   Prolapse has been more bothersome for her recently. Does not want another pessary. Has urinary urgency because she drinks a lot. She is waking up 4-5 times per night. She does not drink before going to bed (does not drink after 6 and goes to bed around 8pm).  Feels that she is done emptying but then has to go again. Also reports leakage with cough and sneeze.    Past Medical History: Patient  has a past medical history of Allergy, Anemia, Attention deficit disorder with [id number](768.11), Complication of anesthesia, Dementia ([place]), Depressive disorder, not elsewhere classified, Diabetes mellitus without complication ([place]), Dysrhythmia, Esophageal reflux, GERD (gastroesophageal reflux disease), HLD (hyperlipidemia), Hyperthyroidism, Insomnia, Kidney disease, Kidney stones, Lung tumor (2018), Migraine, Neuromuscular disorder ([place]), Obstructive sleep apnea, Osteoarthritis, Osteoporosis, Other and unspecified hyperlipidemia, Other chronic cystitis, Panic attacks, Posttraumatic stress disorder, Sleep apnea, Stricture and stenosis of esophagus, Temporomandibular joint disorders, unspecified, Thoracic spondylosis without myelopathy, and Unspecified hypothyroidism.   Past Surgical History: She  has a past surgical history that includes Appendectomy; Cholecystectomy; Tubal ligation; Eye surgery (Right); Abdominal hysterectomy; Tonsillectomy; Colonoscopy; Lumbar laminectomy/decompression microdiscectomy (Right, 01/28/2013); Transforaminal lumbar interbody fusion (tlif) with pedicle screw fixation 1 level (Right, 11/25/2013); and Cystorrhaphy.   Medications: She has a current medication list which includes the following prescription(s): aspirin, atorvastatin, levothyroxine, timolol, and OVER THE COUNTER MEDICATION.    Allergies: Patient is allergic to tetracycline and lactose intolerance (gi).   Social History: Patient  reports that she has quit smoking. Her smoking use included cigarettes. She has a 10.00 pack-year smoking history. She has never used smokeless tobacco. She reports that she does not drink alcohol and does not use drugs.      OBJECTIVE     Physical Exam: Vitals:   11/06/21 1122  BP: 120/74  Pulse: 65   Gen: No apparent distress, A&O x 3.  Detailed Urogynecologic Evaluation:  Normal external genitalia.  On speculum exam, normal vaginal mucosa with atrophy   POP-Q  -2                                            Aa   -2                                           Ba  3.5                                              C   4                                            Gh  3  Pb  7.5                                            tvl   0                                            Ap  0                                            Bp                                                 D        ASSESSMENT AND PLAN    Brianna [name] is a 76 y.o. with:  1. Prolapse of posterior vaginal wall   2. Prolapse of anterior vaginal wall   3. Vaginal vault prolapse after hysterectomy   4. SUI (stress urinary incontinence, female)     - Discussed options for surgery- SSLF vs colpocleisis w/ perineorrhaphy which has a higher success rate. She prefers Colpocleisis and handout provided.  - She will need urodynamic testing due to mixed incontinence.   Follow up for urodynamics  Jaquita Folds, MD  Time spent: I spent 30 minutes dedicated to the care of this patient on the date of this encounter to include pre-visit review of records, face-to-face time with the patient and post visit documentation.

## 2021-12-11 ENCOUNTER — Telehealth: Payer: Self-pay

## 2021-12-11 NOTE — Telephone Encounter (Signed)
Brianna [name] daughter of KONNER WARRIOR is a 76 y.o. female called in because she wants to cancel her mothers Urodynamics test. Pt's daughter said Pt said she does not want surgery on her bladder she just wants the rectocele fixed. Brianna [name] said they are ready to scheduled. I explained Dr. Wannetta Sender was out of the office and I will relay this message when she returns next week.

## 2021-12-27 ENCOUNTER — Encounter: Payer: PPO | Admitting: Obstetrics and Gynecology

## 2022-05-27 DIAGNOSIS — F1220 Cannabis dependence, uncomplicated: Secondary | ICD-10-CM | POA: Insufficient documentation

## 2022-05-27 DIAGNOSIS — F332 Major depressive disorder, recurrent severe without psychotic features: Secondary | ICD-10-CM | POA: Insufficient documentation

## 2022-06-28 ENCOUNTER — Encounter (HOSPITAL_COMMUNITY): Payer: Self-pay | Admitting: Psychiatry

## 2022-06-28 ENCOUNTER — Ambulatory Visit (INDEPENDENT_AMBULATORY_CARE_PROVIDER_SITE_OTHER): Payer: PPO | Admitting: Psychiatry

## 2022-06-28 DIAGNOSIS — F331 Major depressive disorder, recurrent, moderate: Secondary | ICD-10-CM

## 2022-06-28 DIAGNOSIS — F411 Generalized anxiety disorder: Secondary | ICD-10-CM

## 2022-06-28 MED ORDER — BUSPIRONE HCL 7.5 MG PO TABS: 7.5000 mg | ORAL_TABLET | Freq: Three times a day (TID) | ORAL | 0 refills | Status: DC

## 2022-06-28 MED ORDER — MIRTAZAPINE 15 MG PO TABS: 15.0000 mg | ORAL_TABLET | Freq: Every day | ORAL | 0 refills | Status: DC
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
## 2022-07-02 ENCOUNTER — Emergency Department (HOSPITAL_BASED_OUTPATIENT_CLINIC_OR_DEPARTMENT_OTHER): Payer: PPO

## 2022-07-02 ENCOUNTER — Emergency Department (HOSPITAL_BASED_OUTPATIENT_CLINIC_OR_DEPARTMENT_OTHER)
Admission: EM | Admit: 2022-07-02 | Discharge: 2022-07-02 | Discharge status: Against Medical Advice | Payer: PPO | Attending: Emergency Medicine | Admitting: Emergency Medicine

## 2022-07-02 ENCOUNTER — Encounter (HOSPITAL_BASED_OUTPATIENT_CLINIC_OR_DEPARTMENT_OTHER): Payer: Self-pay | Admitting: *Deleted

## 2022-07-02 ENCOUNTER — Other Ambulatory Visit: Payer: Self-pay

## 2022-07-02 DIAGNOSIS — Z5321 Procedure and treatment not carried out due to patient leaving prior to being seen by health care provider: Secondary | ICD-10-CM | POA: Insufficient documentation

## 2022-07-02 DIAGNOSIS — M79672 Pain in left foot: Secondary | ICD-10-CM | POA: Diagnosis not present

## 2022-07-02 MED ORDER — ACETAMINOPHEN 325 MG PO TABS
650.0000 mg | ORAL_TABLET | Freq: Once | ORAL | Status: AC
  Administered 2022-07-02: 650 mg via ORAL

## 2022-07-02 MED ORDER — ACETAMINOPHEN 325 MG PO TABS
ORAL_TABLET | ORAL | Status: AC
  Filled 2022-07-02: qty 2

## 2022-07-02 NOTE — ED Notes (Signed)
To xray via w/c.

## 2022-07-02 NOTE — ED Notes (Signed)
Pt and daughter state that they wish to leave and follow up with PMD.

## 2022-07-02 NOTE — ED Triage Notes (Signed)
Here by POV with family s/p L foot injury which occurred around 0900. Describes heavy wooden table fell on foot. Initially thought was OK, but pain developed at 1800 and gradually worsened. C/o pain only. Pinpoints pain to anterior and medial foot. No obvious bruising, swelling, or wound. CMS, and skin intact. ROM limited. Pedal pulses palpable. Denies ankle pain.

## 2022-07-29 ENCOUNTER — Telehealth (HOSPITAL_COMMUNITY): Payer: Self-pay | Admitting: Psychiatry

## 2022-07-29 NOTE — Telephone Encounter (Signed)
Created by mistake

## 2022-08-02 ENCOUNTER — Telehealth (HOSPITAL_COMMUNITY): Payer: PPO | Admitting: Psychiatry

## 2022-08-02 ENCOUNTER — Encounter (HOSPITAL_COMMUNITY): Payer: Self-pay

## 2022-08-05 ENCOUNTER — Encounter (HOSPITAL_COMMUNITY): Payer: Self-pay | Admitting: Psychiatry

## 2022-08-05 ENCOUNTER — Telehealth (INDEPENDENT_AMBULATORY_CARE_PROVIDER_SITE_OTHER): Payer: PPO | Admitting: Psychiatry

## 2022-08-05 DIAGNOSIS — F331 Major depressive disorder, recurrent, moderate: Secondary | ICD-10-CM | POA: Diagnosis not present

## 2022-08-05 DIAGNOSIS — F411 Generalized anxiety disorder: Secondary | ICD-10-CM | POA: Diagnosis not present

## 2022-08-05 MED ORDER — MIRTAZAPINE 15 MG PO TABS: 15.0000 mg | ORAL_TABLET | Freq: Every day | ORAL | 1 refills | Status: DC

## 2022-08-05 MED ORDER — BUSPIRONE HCL 7.5 MG PO TABS: 7.5000 mg | ORAL_TABLET | Freq: Three times a day (TID) | ORAL | 1 refills | Status: DC
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
## 2022-10-07 ENCOUNTER — Encounter (HOSPITAL_COMMUNITY): Payer: Self-pay | Admitting: Psychiatry

## 2022-10-07 ENCOUNTER — Telehealth (INDEPENDENT_AMBULATORY_CARE_PROVIDER_SITE_OTHER): Payer: PPO | Admitting: Psychiatry

## 2022-10-07 DIAGNOSIS — F411 Generalized anxiety disorder: Secondary | ICD-10-CM | POA: Diagnosis not present

## 2022-10-07 DIAGNOSIS — F331 Major depressive disorder, recurrent, moderate: Secondary | ICD-10-CM | POA: Diagnosis not present

## 2022-10-07 MED ORDER — BUSPIRONE HCL 7.5 MG PO TABS: 7.5000 mg | ORAL_TABLET | Freq: Three times a day (TID) | ORAL | 1 refills | Status: DC

## 2022-10-07 MED ORDER — MIRTAZAPINE 15 MG PO TABS: 15.0000 mg | ORAL_TABLET | Freq: Every day | ORAL | 1 refills | Status: DC

## 2022-10-07 NOTE — Progress Notes (Signed)
[facility] Follow up visit  Patient Identification: Brianna [name] MRN:  [id number] Date of Evaluation:  10/07/2022 Referral Source: primary care Chief Complaint:   No chief complaint on file. Follow up depression  Visit Diagnosis:    ICD-10-CM   1. MDD (major depressive disorder), recurrent episode, moderate (HCC)  F33.1     2. GAD (generalized anxiety disorder)  F41.1     Virtual Visit via Video Note  I connected with Brianna [name] on 10/07/22 at  3:00 PM EDT by a video enabled telemedicine application and verified that I am speaking with the correct person using two identifiers.  Location: Patient: home Provider: home office   I discussed the limitations of evaluation and management by telemedicine and the availability of in person appointments. The patient expressed understanding and agreed to proceed.     I discussed the assessment and treatment plan with the patient. The patient was provided an opportunity to ask questions and all were answered. The patient agreed with the plan and demonstrated an understanding of the instructions.   The patient was advised to call back or seek an in-person evaluation if the symptoms worsen or if the condition fails to improve as anticipated.  I provided 20 minutes of non-face-to-face time during this encounter.     History of Present Illness: Patient is a 77 years old currently white Caucasian female referred initially by primary care physician and herself to establish care for her depression and anxiety patient currently lives by herself she has a caregiver  Patient gives a complicated long history of multiple hospitalization , ECT in past admission for depression and suicidal ideation last was a month ago according to her she was feeling down depressed and suicidal this was after a trauma incident related to her son who moved out to [place] along with his daughter and that was traumatizing.   Son is back to [place], now is talking   Patient usually by herself, feels med keeping some balance and she looks forward to do things Sleep fair on remeron,    Aggravating factors; trauma incident on last year when her son left her for home she bought a house to live together.  Widow in 2020, sister is sick  Modifying factors: caregiver, family , daughter  Duration adult life Severity fair,   Previous Psychotropic Medications: Yes  Most meds used including ellavil in 1975, lexapro  Substance Abuse History in the last 12 months:  Yes.    Consequences of Substance Abuse: One drink a day . Last used one month ago. Discussed risk of depression, memory  Past Medical History:  Past Medical History:  Diagnosis Date   Allergy    Anemia    Attention deficit disorder with hyperactivity(314.01)    Complication of anesthesia    shaking after sugery in the 1980's   Dementia (HCC)    Depressive disorder, not elsewhere classified    Diabetes mellitus without complication (HCC)    lost weight and no longer has the issue with blood sugars   Dysrhythmia    Esophageal reflux    GERD (gastroesophageal reflux disease)    HLD (hyperlipidemia)    Hyperthyroidism    per patient   Insomnia    Kidney disease    stage 3 per pt   Kidney stones    Lung tumor 2018   Migraine    Neuromuscular disorder (HCC)    Obstructive sleep apnea    Osteoarthritis    Osteoporosis  Other and unspecified hyperlipidemia    Other chronic cystitis    Panic attacks    Posttraumatic stress disorder    followed by Dr Ladona Ridgel, psychiatry   Sleep apnea    Stricture and stenosis of esophagus    Temporomandibular joint disorders, unspecified    Thoracic spondylosis without myelopathy    Unspecified hypothyroidism     Past Surgical History:  Procedure Laterality Date   ABDOMINAL HYSTERECTOMY     APPENDECTOMY     CHOLECYSTECTOMY     COLONOSCOPY     CYSTORRHAPHY     "bladder tack"   EYE SURGERY Right    LUMBAR LAMINECTOMY/DECOMPRESSION  MICRODISCECTOMY Right 01/28/2013   Procedure: LUMBAR LAMINECTOMY/DECOMPRESSION MICRODISCECTOMY LUMBAR FOUR-FIVE;  Surgeon: Reinaldo Meeker, MD;  Location: [facility];  Service: Neurosurgery;  Laterality: Right;   TONSILLECTOMY     TRANSFORAMINAL LUMBAR INTERBODY FUSION (TLIF) WITH PEDICLE SCREW FIXATION 1 LEVEL Right 11/25/2013   Procedure: TRANSFORAMINAL LUMBAR INTERBODY FUSION (TLIF) WITH PEDICLE SCREW FIXATION 1 LEVEL;  Surgeon: Reinaldo Meeker, MD;  Location: [facility];  Service: Neurosurgery;  Laterality: Right;  TRANSFORAMINAL LUMBAR INTERBODY FUSION (TLIF) WITH PEDICLE SCREW FIXATION 1 LEVEL LUMBAR 4-5   TUBAL LIGATION        Family History:  Family History  Problem Relation Age of Onset   Heart attack Father    Heart failure Father    Liver disease Father    Heart disease Father    COPD Mother    Depression Mother    Diabetes Mother    Crohn's disease Son    Colon cancer Neg Hx    Colon polyps Neg Hx    Esophageal cancer Neg Hx    Rectal cancer Neg Hx    Stomach cancer Neg Hx     Social History:   Social History   Socioeconomic History   Marital status: Widowed    Spouse name: Not on file   Number of children: 4   Years of education: Not on file   Highest education level: Not on file  Occupational History   Occupation: retired    Associate Professor: UNEMPLOYED  Tobacco Use   Smoking status: Former    Packs/day: 0.50    Years: 20.00    Additional pack years: 0.00    Total pack years: 10.00    Types: Cigarettes   Smokeless tobacco: Never  Vaping Use   Vaping Use: Never used  Substance and Sexual Activity   Alcohol use: No    Alcohol/week: 0.0 standard drinks of alcohol   Drug use: No   Sexual activity: Not Currently    Birth control/protection: Surgical  Other Topics Concern   Not on file  Social History Narrative   Pt drinks two cups of coffee daily.  And does praise and worship daily   Social Determinants of Health   Financial Resource Strain: Not on  file  Food Insecurity: Not on file  Transportation Needs: Not on file  Physical Activity: Not on file  Stress: Not on file  Social Connections: Not on file     Allergies:   Allergies  Allergen Reactions   Tetracycline Swelling   Lactose Intolerance (Gi) Nausea And Vomiting    Metabolic Disorder Labs: No results found for: "HGBA1C", "MPG" No results found for: "PROLACTIN" No results found for: "CHOL", "TRIG", "HDL", "CHOLHDL", "VLDL", "LDLCALC" Lab Results  Component Value Date   TSH 11.692 (H) 07/22/2015    Therapeutic Level Labs: No results found for: "LITHIUM"  No results found for: "CBMZ" No results found for: "VALPROATE"  Current Medications: Current Outpatient Medications  Medication Sig Dispense Refill   aspirin 81 MG tablet Take 81 mg by mouth daily.     atorvastatin (LIPITOR) 40 MG tablet Take 40 mg by mouth every evening.  2   busPIRone (BUSPAR) 7.5 MG tablet Take 1 tablet (7.5 mg total) by mouth 3 (three) times daily. 90 tablet 1   levothyroxine (SYNTHROID, LEVOTHROID) 75 MCG tablet Take 1 tablet (75 mcg total) by mouth daily. 30 tablet 0   mirtazapine (REMERON) 15 MG tablet Take 1 tablet (15 mg total) by mouth at bedtime. 30 tablet 1   OVER THE COUNTER MEDICATION CBD Oil, Two gummies daily.     timolol (TIMOPTIC) 0.5 % ophthalmic solution Place 1 drop into both eyes 2 (two) times daily.  2   No current facility-administered medications for this visit.     Psychiatric Specialty Exam: Review of Systems  Cardiovascular:  Negative for chest pain.  Psychiatric/Behavioral:  Negative for agitation.     There were no vitals taken for this visit.There is no height or weight on file to calculate BMI.  General Appearance: Casual  Eye Contact:  Fair  Speech:  Slow  Volume:  Normal  Mood:  fair  Affect:  Constricted  Thought Process:  Goal Directed  Orientation:  Full (Time, Place, and Person)  Thought Content:  Rumination  Suicidal Thoughts:  No  Homicidal  Thoughts:  No  Memory:  Immediate;   Fair  Judgement:  Fair  Insight:  Fair  Psychomotor Activity:  Normal  Concentration:  Concentration: Fair  Recall:  [employer] of Knowledge:Fair  Language: Fair  Akathisia:  No  Handed:    AIMS (if indicated):  not done  Assets:  Desire for Improvement Financial Resources/Insurance  ADL's:  Intact  Cognition: WNL  Sleep:  Fair   Screenings: [employer] Row Office Visit from 06/28/2022 in [facility] at [facility] Patient Outreach Telephone from 09/09/2016 in [facility]  PHQ-2 Total Score 1 0      Flowsheet Row Office Visit from 06/28/2022 in [facility] at [facility]  C-SSRS RISK CATEGORY No Risk       Assessment and Plan: as follows  Prior documentation reviewed  Major depressive disorder recurrent severe; managable continue remeron, add activities, daughter visits her  Generalized anxiety disorder;manageable on buspar, will continue    Insomnia; fair on remeron  Will fu 38m.  Questions addressed refill sent follow-up earlier in case needed otherwise follow-up i68m  Collaboration of Care: Primary Care Provider AEB reviewed notes and chart   Patient/Guardian was advised Release of Information must be obtained prior to any record release in order to collaborate their care with an outside provider. Patient/Guardian was advised if they have not already done so to contact the registration department to sign all necessary forms in order for [place] to release information regarding their care.   Consent: Patient/Guardian gives verbal consent for treatment and assignment of benefits for services provided during this visit. Patient/Guardian expressed understanding and agreed to proceed.   Thresa Ross, MD 7/1/20243:10 PM

## 2022-12-11 ENCOUNTER — Telehealth (HOSPITAL_COMMUNITY): Payer: PPO | Admitting: Psychiatry

## 2022-12-11 ENCOUNTER — Encounter (HOSPITAL_COMMUNITY): Payer: Self-pay

## 2022-12-20 ENCOUNTER — Telehealth (HOSPITAL_COMMUNITY): Payer: Self-pay | Admitting: Psychiatry

## 2022-12-20 MED ORDER — BUSPIRONE HCL 7.5 MG PO TABS: 7.5000 mg | ORAL_TABLET | Freq: Three times a day (TID) | ORAL | 0 refills | Status: DC

## 2022-12-20 MED ORDER — MIRTAZAPINE 15 MG PO TABS: 15.0000 mg | ORAL_TABLET | Freq: Every day | ORAL | 0 refills | Status: DC

## 2022-12-20 NOTE — Telephone Encounter (Signed)
Patient left voicemail requesting refills of:   busPIRone (BUSPAR) 7.5 MG tablet  Last ordered:  10/07/2022 - 90 tablets with 1 refill  mirtazapine (REMERON) 15 MG tablet  Last ordered: 10/07/2022 - 30 tablets with 1 refill  [facility] [address] - [place], [place] - [address] Phone: [phone]  Fax: [phone]     Last visit: 10/07/2022  Next visit: 01/06/2023

## 2023-01-06 ENCOUNTER — Telehealth (HOSPITAL_COMMUNITY): Payer: PPO | Admitting: Psychiatry

## 2023-01-17 ENCOUNTER — Encounter (HOSPITAL_COMMUNITY): Payer: Self-pay | Admitting: Psychiatry

## 2023-01-17 ENCOUNTER — Telehealth (HOSPITAL_COMMUNITY): Payer: PPO | Admitting: Psychiatry

## 2023-01-17 DIAGNOSIS — F411 Generalized anxiety disorder: Secondary | ICD-10-CM

## 2023-01-17 DIAGNOSIS — F332 Major depressive disorder, recurrent severe without psychotic features: Secondary | ICD-10-CM

## 2023-01-17 DIAGNOSIS — G4700 Insomnia, unspecified: Secondary | ICD-10-CM | POA: Diagnosis not present

## 2023-01-17 DIAGNOSIS — F331 Major depressive disorder, recurrent, moderate: Secondary | ICD-10-CM

## 2023-01-17 MED ORDER — MIRTAZAPINE 15 MG PO TABS: 15.0000 mg | ORAL_TABLET | Freq: Every day | ORAL | 2 refills | Status: DC

## 2023-01-17 MED ORDER — BUSPIRONE HCL 7.5 MG PO TABS: 7.5000 mg | ORAL_TABLET | Freq: Two times a day (BID) | ORAL | 1 refills | Status: DC

## 2023-01-17 NOTE — Progress Notes (Signed)
[facility] Follow up visit  Patient Identification: Brianna [name] MRN:  [id number] Date of Evaluation:  01/17/2023 Referral Source: primary care Chief Complaint:   No chief complaint on file. Follow up depression  Visit Diagnosis:    ICD-10-CM   1. MDD (major depressive disorder), recurrent episode, moderate (HCC)  F33.1     2. GAD (generalized anxiety disorder)  F41.1      Virtual Visit via Video Note  I connected with Brianna [name] on 01/17/23 at 10:30 AM EDT by a video enabled telemedicine application and verified that I am speaking with the correct person using two identifiers.  Location: Patient: home Provider: home office   I discussed the limitations of evaluation and management by telemedicine and the availability of in person appointments. The patient expressed understanding and agreed to proceed.      I discussed the assessment and treatment plan with the patient. The patient was provided an opportunity to ask questions and all were answered. The patient agreed with the plan and demonstrated an understanding of the instructions.   The patient was advised to call back or seek an in-person evaluation if the symptoms worsen or if the condition fails to improve as anticipated.  I provided 18 minutes of non-face-to-face time during this encounter.      History of Present Illness: Patient is a 77 years old currently white Caucasian female referred initially by primary care physician and herself to establish care for her depression and anxiety patient currently lives by herself she has a caregiver  Patient gives a complicated long history of multiple hospitalization , ECT in past admission for depression and suicidal ideation last was a month ago according to her she was feeling down depressed and suicidal this was after a trauma incident related to her son who moved out to [place] along with his daughter and that was traumatizing.   Son Is back in [place] but not  paying for the house, some challenges for the patient while she supports him and may put house in [place] Other son is good and living and helping her out.   Aggravating factors; trauma incident on last year when her son left her for home she bought a house to live together.  Widow , sister is sick,   Modifying factors: caregiver, family ,daughter, one son  Duration adult life Severity  fair  Previous Psychotropic Medications: Yes  Most meds used including ellavil in 1975, lexapro  Substance Abuse History in the last 12 months:  Yes.    Consequences of Substance Abuse: One drink a day . Last used one month ago. Discussed risk of depression, memory  Past Medical History:  Past Medical History:  Diagnosis Date   Allergy    Anemia    Attention deficit disorder with hyperactivity(314.01)    Complication of anesthesia    shaking after sugery in the 1980's   Dementia (HCC)    Depressive disorder, not elsewhere classified    Diabetes mellitus without complication (HCC)    lost weight and no longer has the issue with blood sugars   Dysrhythmia    Esophageal reflux    GERD (gastroesophageal reflux disease)    HLD (hyperlipidemia)    Hyperthyroidism    per patient   Insomnia    Kidney disease    stage 3 per pt   Kidney stones    Lung tumor 2018   Migraine    Neuromuscular disorder (HCC)    Obstructive sleep apnea  Osteoarthritis    Osteoporosis    Other and unspecified hyperlipidemia    Other chronic cystitis    Panic attacks    Posttraumatic stress disorder    followed by Dr Ladona Ridgel, psychiatry   Sleep apnea    Stricture and stenosis of esophagus    Temporomandibular joint disorders, unspecified    Thoracic spondylosis without myelopathy    Unspecified hypothyroidism     Past Surgical History:  Procedure Laterality Date   ABDOMINAL HYSTERECTOMY     APPENDECTOMY     CHOLECYSTECTOMY     COLONOSCOPY     CYSTORRHAPHY     "bladder tack"   EYE SURGERY Right     LUMBAR LAMINECTOMY/DECOMPRESSION MICRODISCECTOMY Right 01/28/2013   Procedure: LUMBAR LAMINECTOMY/DECOMPRESSION MICRODISCECTOMY LUMBAR FOUR-FIVE;  Surgeon: Reinaldo Meeker, MD;  Location: [facility];  Service: Neurosurgery;  Laterality: Right;   TONSILLECTOMY     TRANSFORAMINAL LUMBAR INTERBODY FUSION (TLIF) WITH PEDICLE SCREW FIXATION 1 LEVEL Right 11/25/2013   Procedure: TRANSFORAMINAL LUMBAR INTERBODY FUSION (TLIF) WITH PEDICLE SCREW FIXATION 1 LEVEL;  Surgeon: Reinaldo Meeker, MD;  Location: [facility];  Service: Neurosurgery;  Laterality: Right;  TRANSFORAMINAL LUMBAR INTERBODY FUSION (TLIF) WITH PEDICLE SCREW FIXATION 1 LEVEL LUMBAR 4-5   TUBAL LIGATION        Family History:  Family History  Problem Relation Age of Onset   Heart attack Father    Heart failure Father    Liver disease Father    Heart disease Father    COPD Mother    Depression Mother    Diabetes Mother    Crohn's disease Son    Colon cancer Neg Hx    Colon polyps Neg Hx    Esophageal cancer Neg Hx    Rectal cancer Neg Hx    Stomach cancer Neg Hx     Social History:   Social History   Socioeconomic History   Marital status: Widowed    Spouse name: Not on file   Number of children: 4   Years of education: Not on file   Highest education level: Not on file  Occupational History   Occupation: retired    Associate Professor: UNEMPLOYED  Tobacco Use   Smoking status: Former    Current packs/day: 0.50    Average packs/day: 0.5 packs/day for 20.0 years (10.0 ttl pk-yrs)    Types: Cigarettes   Smokeless tobacco: Never  Vaping Use   Vaping status: Never Used  Substance and Sexual Activity   Alcohol use: No    Alcohol/week: 0.0 standard drinks of alcohol   Drug use: No   Sexual activity: Not Currently    Birth control/protection: Surgical  Other Topics Concern   Not on file  Social History Narrative   Pt drinks two cups of coffee daily.  And does praise and worship daily   Social Determinants of Health    Financial Resource Strain: Not on file  Food Insecurity: Low Risk  (05/27/2022)   [facility]   Hunger Vital Sign    Worried About Running Out of Food in the Last Year: Never true    Within the past 12 months, the food you bought just didn't last and you didn't have money to get more: Not on file  Transportation Needs: Unmet Transportation Needs (05/27/2022)   [facility]   Transportation    In the past 12 months, has lack of reliable transportation kept you from medical appointments, meetings, work  or from getting things needed for daily living? : Yes  Physical Activity: Not on file  Stress: Not on file  Social Connections: Not on file     Allergies:   Allergies  Allergen Reactions   Tetracycline Swelling   Lactose Intolerance (Gi) Nausea And Vomiting    Metabolic Disorder Labs: No results found for: "HGBA1C", "MPG" No results found for: "PROLACTIN" No results found for: "CHOL", "TRIG", "HDL", "CHOLHDL", "VLDL", "LDLCALC" Lab Results  Component Value Date   TSH 11.692 (H) 07/22/2015    Therapeutic Level Labs: No results found for: "LITHIUM" No results found for: "CBMZ" No results found for: "VALPROATE"  Current Medications: Current Outpatient Medications  Medication Sig Dispense Refill   aspirin 81 MG tablet Take 81 mg by mouth daily.     atorvastatin (LIPITOR) 40 MG tablet Take 40 mg by mouth every evening.  2   busPIRone (BUSPAR) 7.5 MG tablet Take 1 tablet (7.5 mg total) by mouth 2 (two) times daily. 60 tablet 1   levothyroxine (SYNTHROID, LEVOTHROID) 75 MCG tablet Take 1 tablet (75 mcg total) by mouth daily. 30 tablet 0   mirtazapine (REMERON) 15 MG tablet Take 1 tablet (15 mg total) by mouth at bedtime. 30 tablet 2   OVER THE COUNTER MEDICATION CBD Oil, Two gummies daily.     timolol (TIMOPTIC) 0.5 % ophthalmic solution Place 1 drop into both eyes 2 (two) times daily.  2   No current  facility-administered medications for this visit.     Psychiatric Specialty Exam: Review of Systems  Cardiovascular:  Negative for chest pain.  Psychiatric/Behavioral:  Negative for agitation.     There were no vitals taken for this visit.There is no height or weight on file to calculate BMI.  General Appearance: Casual  Eye Contact:  Fair  Speech:  Slow  Volume:  Normal  Mood:  fair  Affect:  Constricted  Thought Process:  Goal Directed  Orientation:  Full (Time, Place, and Person)  Thought Content:  Rumination  Suicidal Thoughts:  No  Homicidal Thoughts:  No  Memory:  Immediate;   Fair  Judgement:  Fair  Insight:  Fair  Psychomotor Activity:  Normal  Concentration:  Concentration: Fair  Recall:  [employer] of Knowledge:Fair  Language: Fair  Akathisia:  No  Handed:    AIMS (if indicated):  not done  Assets:  Desire for Improvement Financial Resources/Insurance  ADL's:  Intact  Cognition: WNL  Sleep:  Fair   Screenings: PHQ2-9    Flowsheet Row Office Visit from 06/28/2022 in [facility] at [facility] Patient Outreach Telephone from 09/09/2016 in [facility]  PHQ-2 Total Score 1 0      Flowsheet Row Office Visit from 06/28/2022 in [facility]  C-SSRS RISK CATEGORY No Risk       Assessment and Plan: as follows  Prior documentation reviewed  Major depressive disorder recurrent severe;manageable continue ME time has a supportive sone. Continue remeron  Generalized anxiety disorder; manageable taking buspar bid not tid, will change continue coping skills    Insomnia; fair on remeron  Fu 77m. Renewed meds  Questions addressed refill sent follow-up earlier in case needed otherwise follow-up i22m  Collaboration of Care: Primary Care Provider AEB reviewed notes and chart   Patient/Guardian was advised Release of Information must be obtained prior to any  record release in order to collaborate their care with an outside provider. Patient/Guardian was advised  if they have not already done so to contact the registration department to sign all necessary forms in order for [place] to release information regarding their care.   Consent: Patient/Guardian gives verbal consent for treatment and assignment of benefits for services provided during this visit. Patient/Guardian expressed understanding and agreed to proceed.   Thresa Ross, MD 10/11/202410:40 AM

## 2023-03-25 ENCOUNTER — Ambulatory Visit (INDEPENDENT_AMBULATORY_CARE_PROVIDER_SITE_OTHER): Payer: PPO | Admitting: Obstetrics and Gynecology

## 2023-03-25 ENCOUNTER — Encounter: Payer: Self-pay | Admitting: Obstetrics and Gynecology

## 2023-03-25 VITALS — BP 147/78 | HR 69

## 2023-03-25 DIAGNOSIS — N393 Stress incontinence (female) (male): Secondary | ICD-10-CM

## 2023-03-25 DIAGNOSIS — N3281 Overactive bladder: Secondary | ICD-10-CM | POA: Diagnosis not present

## 2023-03-25 DIAGNOSIS — R350 Frequency of micturition: Secondary | ICD-10-CM

## 2023-03-25 DIAGNOSIS — R948 Abnormal results of function studies of other organs and systems: Secondary | ICD-10-CM

## 2023-03-25 LAB — POCT URINALYSIS DIPSTICK
Bilirubin, UA: NEGATIVE
Glucose, UA: NEGATIVE
Ketones, UA: NEGATIVE
Nitrite, UA: NEGATIVE
Protein, UA: NEGATIVE
Spec Grav, UA: 1.01 (ref 1.010–1.025)
Urobilinogen, UA: 0.2 U/dL
pH, UA: 6.5 (ref 5.0–8.0)

## 2023-03-25 NOTE — Progress Notes (Signed)
[facility] Urodynamics Procedure  Referring Physician: Bernadette Hoit, MD Date of Procedure: 03/25/2023  Brianna [name] is a 77 y.o. female who presents for urodynamic evaluation. Indication(s) for study: SUI and OAB  Vital Signs: BP (!) 147/78   Pulse 69   Laboratory Results: A catheterized urine specimen revealed:  Lab Results  Component Value Date   COLORU yellow 03/25/2023   CLARITYU clear 03/25/2023   GLUCOSEUR Negative 03/25/2023   BILIRUBINUR negative 03/25/2023   KETONESU negative 03/25/2023   SPECGRAV 1.010 03/25/2023   RBCUR Trace intact 03/25/2023   PHUR 6.5 03/25/2023   PROTEINUR Negative 03/25/2023   UROBILINOGEN 0.2 03/25/2023   LEUKOCYTESUR Trace (A) 03/25/2023      Voiding Diary: Not Done  Procedure Timeout:  The correct patient was verified and the correct procedure was verified. The patient was in the correct position and safety precautions were reviewed based on at the patient's history.  Urodynamic Procedure A 71F dual lumen urodynamics catheter was placed under sterile conditions into the patient's bladder. A 71F catheter was placed into the rectum in order to measure abdominal pressure. EMG patches were placed in the appropriate position.  All connections were confirmed and calibrations/adjusted made. Saline was instilled into the bladder through the dual lumen catheters.  Cough/valsalva pressures were measured periodically during filling.  Patient was allowed to void.  The bladder was then emptied of its residual.  UROFLOW: Revealed a Qmax of 7 mL/sec.  She voided 86 mL and had a residual of 25 mL.  It was a interrupted pattern and represented normal habits though interpretation limited due to low voided volume.  CMG: This was performed with sterile water in the sitting position at a fill rate of 20-30 mL/min.    First sensation of fullness was 143 mLs,  First urge was 209 mLs,  Strong urge was 233 mLs and  Capacity was 315  mLs  Stress incontinence was demonstrated Lowest positive Barrier CLPP was 5 cmH20 at 202 ml. Lowest positive Barrier VLPP was 48 cmH20 at 202 ml.  Detrusor function was normal with prolapse present, once prolapse was reduced there were multiple episodes of detrusor overactivity.  The first occurred at 216 mL to 11 cm of water and was associated with leakage.  Compliance:  Normal. End fill detrusor pressure was 0cmH20.    UPP: MUCP with barrier reduction was 22 cm of water.    MICTURITION STUDY: Voiding was performed with reduction using scopettes in the sitting position.  Pdet at Qmax was 23 cm of water.  Qmax was 12.5 mL/sec.  It was a interrupted pattern.  She voided 190 mL and had a residual of 125 mL.  It was a volitional void, sustained detrusor contraction was present and abdominal straining was present at the start of void and then was not present for the rest of void.   EMG: This was performed with patches.  She had voluntary contractions, recruitment with fill was present and urethral sphincter was not relaxed with void.  The details of the procedure with the study tracings have been scanned into EPIC.   Urodynamic Impression:  1. Sensation was normal; capacity was low normal 2. Stress Incontinence was demonstrated at ISD range pressures; 3. Detrusor Overactivity was demonstrated with leakage with prolapse reduction. 4. Emptying was dysfunctional with a mildly elevated PVR (125), a sustained detrusor contraction present,  abdominal straining present at the start of the void, dyssynergic urethral sphincter activity on EMG.  Plan: - The patient  will follow up  to discuss the findings and treatment options.

## 2023-03-27 ENCOUNTER — Telehealth (HOSPITAL_COMMUNITY): Payer: Self-pay | Admitting: *Deleted

## 2023-03-27 NOTE — Telephone Encounter (Signed)
Patient called [facility] that she has been taking her  2 times  @ q QM:VHQIONGEXBM (REMERON) 15 MG tablet Daily at bedtime  instead of the -- busPIRone (BUSPAR) 7.5 MG tablet  2 (two) times daily   She stated she has 10 tablets left  & that her next appt is on 04/18/22 & it won't last until then  [facility] [address] - [place], [place] - [address]

## 2023-03-28 ENCOUNTER — Other Ambulatory Visit (HOSPITAL_COMMUNITY): Payer: Self-pay | Admitting: *Deleted

## 2023-03-28 MED ORDER — MIRTAZAPINE 15 MG PO TABS: 15.0000 mg | ORAL_TABLET | Freq: Every day | ORAL | 2 refills | Status: DC

## 2023-03-28 MED ORDER — BUSPIRONE HCL 7.5 MG PO TABS: 7.5000 mg | ORAL_TABLET | Freq: Two times a day (BID) | ORAL | 1 refills | Status: DC

## 2023-03-28 NOTE — Telephone Encounter (Signed)
co sign -REMERON

## 2023-03-28 NOTE — Progress Notes (Signed)
co sign -REMERON

## 2023-03-28 NOTE — Progress Notes (Signed)
co sign Buspar

## 2023-04-04 ENCOUNTER — Encounter: Payer: Self-pay | Admitting: Obstetrics and Gynecology

## 2023-04-04 ENCOUNTER — Ambulatory Visit (INDEPENDENT_AMBULATORY_CARE_PROVIDER_SITE_OTHER): Payer: PPO | Admitting: Obstetrics and Gynecology

## 2023-04-04 VITALS — BP 143/104 | HR 76

## 2023-04-04 DIAGNOSIS — N393 Stress incontinence (female) (male): Secondary | ICD-10-CM

## 2023-04-04 DIAGNOSIS — N3281 Overactive bladder: Secondary | ICD-10-CM | POA: Diagnosis not present

## 2023-04-04 DIAGNOSIS — N993 Prolapse of vaginal vault after hysterectomy: Secondary | ICD-10-CM | POA: Diagnosis not present

## 2023-04-04 NOTE — Patient Instructions (Signed)
Exam under anesthesia, colpocleisis, levator plication with perineorrhaphy, cystoscopy, possible urethral bulking

## 2023-04-04 NOTE — Progress Notes (Signed)
[facility] Return Visit  SUBJECTIVE  History of Present Illness: Brianna [name] is a 77 y.o. female seen in follow-up for prolapse and incontinence. She underwent urodynamic testing.   She has been feeling better because she is on what she describes as bed rest- she is modifying her activity, not climbing as many stairs. Feels her prolapse symptoms are better. She is not having as much urinary frequency. No longer taking myrbetriq.   Urodynamic Impression:  1. Sensation was normal; capacity was low normal 2. Stress Incontinence was demonstrated at ISD range pressures; 3. Detrusor Overactivity was demonstrated with leakage with prolapse reduction. 4. Emptying was dysfunctional with a mildly elevated PVR (125), a sustained detrusor contraction present,  abdominal straining present at the start of the void, dyssynergic urethral sphincter activity on EMG.  Last Hgb A1c on 02/17/23 was 5.8%  Past Medical History: Patient  has a past medical history of Allergy, Anemia, Attention deficit disorder with hyperactivity(314.01), Complication of anesthesia, Dementia (HCC), Depressive disorder, not elsewhere classified, Diabetes mellitus without complication (HCC), Dysrhythmia, Esophageal reflux, GERD (gastroesophageal reflux disease), HLD (hyperlipidemia), Hyperthyroidism, Insomnia, Kidney disease, Kidney stones, Lung tumor (2018), Migraine, Neuromuscular disorder (HCC), Obstructive sleep apnea, Osteoarthritis, Osteoporosis, Other and unspecified hyperlipidemia, Other chronic cystitis, Panic attacks, Posttraumatic stress disorder, Sleep apnea, Stricture and stenosis of esophagus, Temporomandibular joint disorders, unspecified, Thoracic spondylosis without myelopathy, and Unspecified hypothyroidism.   Past Surgical History: She  has a past surgical history that includes Appendectomy; Cholecystectomy; Tubal ligation; Eye surgery (Right); Abdominal hysterectomy; Tonsillectomy; Colonoscopy; Lumbar  laminectomy/decompression microdiscectomy (Right, 01/28/2013); Transforaminal lumbar interbody fusion (tlif) with pedicle screw fixation 1 level (Right, 11/25/2013); and Cystorrhaphy.   Medications: She has a current medication list which includes the following prescription(s): buspirone, levothyroxine, mirtazapine, OVER THE COUNTER MEDICATION, and timolol.   Allergies: Patient is allergic to tetracycline and lactose intolerance (gi).   Social History: Patient  reports that she has quit smoking. Her smoking use included cigarettes. She has a 10 pack-year smoking history. She has never used smokeless tobacco. She reports that she does not drink alcohol and does not use drugs.     OBJECTIVE     Physical Exam: Vitals:   04/04/23 1339 04/04/23 1412  BP: (!) 156/83 (!) 143/104  Pulse: 76    Gen: No apparent distress, A&O x 3.  Detailed Urogynecologic Evaluation:  Deferred. Prior exam showed:   POP-Q (11/07/22)   -2                                            Aa   -2                                           Ba   3.5                                              C    4  Gh   3                                            Pb   7.5                                            tvl    0                                            Ap   0                                            Bp                                                  D      ASSESSMENT AND PLAN    Brianna [name] is a 77 y.o. with:  1. 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 SUI (stress urinary incontinence, female)   3. Overactive bladder    - We discussed results of urodynamic testing. Due to ISD range pressures, high likelihood of worsening incontinence after prolapse repair. Due to voiding dysfunction, would recommend urethral bulking rather than a sling. She will likely proceed with the procedure but wants to review this option more- handout provided on Bulkamid. We  discussed success rate of approximately 70-80% and possible need for second injection. We reviewed that this is not a permanent procedure and the Bulkamid does dissolve over time. Risks reviewed including injury to bladder or urethra, UTI, urinary retention and hematuria.    Plan for surgery: Exam under anesthesia, colpocleisis, levator plication with perineorrhaphy, cystoscopy, possible urethral bulking  - We reviewed the patient's specific anatomic and functional findings, with the assistance of diagrams, and together finalized the above procedure. The planned surgical procedures were discussed along with the surgical risks outlined below, which were also provided on a detailed handout. Additional treatment options including expectant management, conservative management, medical management were discussed where appropriate.  We reviewed the benefits and risks of each treatment option.   General Surgical Risks: For all procedures, there are risks of bleeding, infection, damage to surrounding organs including but not limited to bowel, bladder, blood vessels, ureters and nerves, and need for further surgery if an injury were to occur. These risks are all low with minimally invasive surgery.   There are risks of numbness and weakness at any body site or buttock/rectal pain.  It is possible that baseline pain can be worsened by surgery, either with or without mesh. If surgery is vaginal, there is also a low risk of possible conversion to laparoscopy or open abdominal incision where indicated. Very rare risks include blood transfusion, blood clot, heart attack, pneumonia, or death.   There is also a risk of short-term postoperative urinary retention with need to use a catheter. About half of patients need  to go home from surgery with a catheter, which is then later removed in the office. The risk of long-term need for a catheter is very low. There is also a risk of worsening of overactive bladder.    Prolapse (with or without mesh): Risk factors for surgical failure  include things that put pressure on your pelvis and the surgical repair, including obesity, chronic cough, and heavy lifting or straining (including lifting children or adults, straining on the toilet, or lifting heavy objects such as furniture or anything weighing >25 lbs. Risks of recurrence is 20-30% with vaginal native tissue repair and a less than 10% with sacrocolpopexy with mesh.  Recurrence risk with colpocleisis is <5%.  - For preop Visit:  She is required to have a visit within 30 days of her surgery.   - Medical clearance: not required  - Anticoagulant use: No - Medicaid Hysterectomy form: n/a - Accepts blood transfusion: Yes - Expected length of stay: outpatient  Request sent for surgery scheduling.   Brianna Beards, MD

## 2023-04-10 ENCOUNTER — Encounter (HOSPITAL_BASED_OUTPATIENT_CLINIC_OR_DEPARTMENT_OTHER): Payer: Self-pay | Admitting: Obstetrics and Gynecology

## 2023-04-10 NOTE — H&P (Signed)
 [facility] Pre-Operative H&P  Subjective Chief Complaint: PERSIS GRAFFIUS presents for a preoperative encounter.   History of Present Illness: ERCIA CRISAFULLI is a 78 y.o. female who presents for preoperative visit.  She is scheduled to undergo Exam under anesthesia, colpocleisis, levator plication with perineorrhaphy, cystoscopy, possible urethral bulking on 04/13/22.  Her symptoms include vaginal bulge and incontinence, and she was was found to have Stage I anterior, Stage II posterior, Stage III apical prolapse.   Urodynamic Impression:  1. Sensation was normal; capacity was low normal 2. Stress Incontinence was demonstrated at ISD range pressures; 3. Detrusor Overactivity was demonstrated with leakage with prolapse reduction. 4. Emptying was dysfunctional with a mildly elevated PVR (125), a sustained detrusor contraction present,  abdominal straining present at the start of the void, dyssynergic urethral sphincter activity on EMG.  Past Medical History:  Diagnosis Date   Allergy    Anemia    Attention deficit disorder with hyperactivity(314.01)    Complication of anesthesia    shaking after sugery in the 1980's   Dementia (HCC)    Depressive disorder, not elsewhere classified    Diabetes mellitus without complication (HCC)    lost weight and no longer has the issue with blood sugars   Dysrhythmia    Esophageal reflux    GERD (gastroesophageal reflux disease)    HLD (hyperlipidemia)    Hyperthyroidism    per patient   Insomnia    Kidney disease    stage 3 per pt   Kidney stones    Lung tumor 2018   Migraine    Neuromuscular disorder (HCC)    Obstructive sleep apnea    Osteoarthritis    Osteoporosis    Other and unspecified hyperlipidemia    Other chronic cystitis    Panic attacks    Posttraumatic stress disorder    followed by Dr Waddell, psychiatry   Sleep apnea    Stricture and stenosis of esophagus    Temporomandibular joint disorders, unspecified     Thoracic spondylosis without myelopathy    Unspecified hypothyroidism      Past Surgical History:  Procedure Laterality Date   ABDOMINAL HYSTERECTOMY     APPENDECTOMY     CHOLECYSTECTOMY     COLONOSCOPY     CYSTORRHAPHY     bladder tack   EYE SURGERY Right    LUMBAR LAMINECTOMY/DECOMPRESSION MICRODISCECTOMY Right 01/28/2013   Procedure: LUMBAR LAMINECTOMY/DECOMPRESSION MICRODISCECTOMY LUMBAR FOUR-FIVE;  Surgeon: Darina MALVA Boehringer, MD;  Location: [facility];  Service: Neurosurgery;  Laterality: Right;   TONSILLECTOMY     TRANSFORAMINAL LUMBAR INTERBODY FUSION (TLIF) WITH PEDICLE SCREW FIXATION 1 LEVEL Right 11/25/2013   Procedure: TRANSFORAMINAL LUMBAR INTERBODY FUSION (TLIF) WITH PEDICLE SCREW FIXATION 1 LEVEL;  Surgeon: Darina MALVA Boehringer, MD;  Location: [facility];  Service: Neurosurgery;  Laterality: Right;  TRANSFORAMINAL LUMBAR INTERBODY FUSION (TLIF) WITH PEDICLE SCREW FIXATION 1 LEVEL LUMBAR 4-5   TUBAL LIGATION      is allergic to tetracycline and lactose intolerance (gi).   Family History  Problem Relation Age of Onset   Heart attack Father    Heart failure Father    Liver disease Father    Heart disease Father    COPD Mother    Depression Mother    Diabetes Mother    Crohn's disease Son    Colon cancer Neg Hx    Colon polyps Neg Hx    Esophageal cancer Neg Hx    Rectal cancer Neg Hx  Stomach cancer Neg Hx     Social History   Tobacco Use   Smoking status: Former    Current packs/day: 0.50    Average packs/day: 0.5 packs/day for 20.0 years (10.0 ttl pk-yrs)    Types: Cigarettes   Smokeless tobacco: Never  Vaping Use   Vaping status: Never Used  Substance Use Topics   Alcohol use: No    Alcohol/week: 0.0 standard drinks of alcohol   Drug use: No     Review of Systems was negative for a full 10 system review except as noted in the History of Present Illness.  No current facility-administered medications for this encounter.  Current Outpatient  Medications:    busPIRone  (BUSPAR ) 7.5 MG tablet, Take 1 tablet (7.5 mg total) by mouth 2 (two) times daily., Disp: 60 tablet, Rfl: 1   levothyroxine  (SYNTHROID , LEVOTHROID) 75 MCG tablet, Take 1 tablet (75 mcg total) by mouth daily., Disp: 30 tablet, Rfl: 0   mirtazapine  (REMERON ) 15 MG tablet, Take 1 tablet (15 mg total) by mouth at bedtime., Disp: 30 tablet, Rfl: 2   OVER THE COUNTER MEDICATION, CBD Oil, Two gummies daily., Disp: , Rfl:    timolol (TIMOPTIC) 0.5 % ophthalmic solution, Place 1 drop into both eyes 2 (two) times daily., Disp: , Rfl: 2   Objective There were no vitals filed for this visit.  Gen: NAD CV: S1 S2 RRR Lungs: Clear to auscultation bilaterally Abd: soft, nontender   Previous Pelvic Exam showed: POP-Q (11/07/22)   -2                                            Aa   -2                                           Ba   3.5                                              C    4                                            Gh   3                                            Pb   7.5                                            tvl    0                                            Ap   0  Bp                                                  D       Assessment/ Plan  The patient is a 78 y.o. year old with stage III prolapse and SUI scheduled to undergo Exam under anesthesia, colpocleisis, levator plication with perineorrhaphy, cystoscopy, possible urethral bulking .   Rosaline LOISE Caper, MD

## 2023-04-10 NOTE — Progress Notes (Signed)
 Spoke w/ via phone for pre-op interview--- Brianna [name] needs dos----NONE         [name] results------ COVID test -----patient states asymptomatic no test needed Arrive at -------0900 NPO after MN NO Solid Food.  Clear liquids from MN until---0800 Med rec completed Medications to take morning of surgery -----Buspar , Levothyroxine  and Timolol. Diabetic medication ----- Patient instructed no nail polish to be worn day of surgery Patient instructed to bring photo id and insurance card day of surgery Patient aware to have Driver (ride ) / caregiver    for 24 hours after surgery - Son Brianna [name] Patient Special Instructions ----- Pre-Op special Instructions ----- Patient verbalized understanding of instructions that were given at this phone interview. Patient denies chest pain, sob, fever, cough at the interview.

## 2023-04-14 ENCOUNTER — Encounter (HOSPITAL_BASED_OUTPATIENT_CLINIC_OR_DEPARTMENT_OTHER): Payer: Self-pay | Admitting: Obstetrics and Gynecology

## 2023-04-14 ENCOUNTER — Ambulatory Visit (HOSPITAL_BASED_OUTPATIENT_CLINIC_OR_DEPARTMENT_OTHER): Payer: PPO | Admitting: Anesthesiology

## 2023-04-14 ENCOUNTER — Ambulatory Visit (HOSPITAL_BASED_OUTPATIENT_CLINIC_OR_DEPARTMENT_OTHER)
Admission: RE | Admit: 2023-04-14 | Discharge: 2023-04-14 | Disposition: A | Discharge status: Home / Self Care | Payer: PPO | Attending: Obstetrics and Gynecology | Admitting: Obstetrics and Gynecology

## 2023-04-14 ENCOUNTER — Encounter (HOSPITAL_BASED_OUTPATIENT_CLINIC_OR_DEPARTMENT_OTHER)
Admission: RE | Disposition: A | Discharge status: Home / Self Care | Payer: Self-pay | Source: Home / Self Care | Attending: Obstetrics and Gynecology

## 2023-04-14 ENCOUNTER — Other Ambulatory Visit: Payer: Self-pay

## 2023-04-14 DIAGNOSIS — N993 Prolapse of vaginal vault after hysterectomy: Secondary | ICD-10-CM

## 2023-04-14 DIAGNOSIS — Z01818 Encounter for other preprocedural examination: Secondary | ICD-10-CM

## 2023-04-14 DIAGNOSIS — N393 Stress incontinence (female) (male): Secondary | ICD-10-CM | POA: Insufficient documentation

## 2023-04-14 DIAGNOSIS — E039 Hypothyroidism, unspecified: Secondary | ICD-10-CM

## 2023-04-14 DIAGNOSIS — N183 Chronic kidney disease, stage 3 unspecified: Secondary | ICD-10-CM

## 2023-04-14 HISTORY — PX: CYSTOSCOPY: SHX5120

## 2023-04-14 HISTORY — PX: COLPOCLEISIS: SHX6814

## 2023-04-14 HISTORY — PX: RECTOCELE REPAIR: SHX761

## 2023-04-14 LAB — TYPE AND SCREEN
ABO/RH(D): O POS
Antibody Screen: NEGATIVE

## 2023-04-14 SURGERY — COLPOCLEISIS
Anesthesia: General | Site: Vagina

## 2023-04-14 MED ORDER — PHENYLEPHRINE 80 MCG/ML (10ML) SYRINGE FOR IV PUSH (FOR BLOOD PRESSURE SUPPORT)
PREFILLED_SYRINGE | INTRAVENOUS | Status: AC
Start: 2023-04-14 — End: ?
  Filled 2023-04-14: qty 10

## 2023-04-14 MED ORDER — ESTRADIOL 0.1 MG/GM VA CREA: TOPICAL_CREAM | VAGINAL | 1 refills | Status: DC

## 2023-04-14 MED ORDER — PROPOFOL 500 MG/50ML IV EMUL
INTRAVENOUS | Status: AC
  Filled 2023-04-14: qty 50

## 2023-04-14 MED ORDER — ONDANSETRON HCL 4 MG/2ML IJ SOLN
4.0000 mg | Freq: Four times a day (QID) | INTRAMUSCULAR | Status: AC | PRN
  Administered 2023-04-14: 4 mg via INTRAVENOUS

## 2023-04-14 MED ORDER — PHENAZOPYRIDINE HCL 100 MG PO TABS
200.0000 mg | ORAL_TABLET | ORAL | Status: AC
  Administered 2023-04-14: 200 mg via ORAL

## 2023-04-14 MED ORDER — SODIUM CHLORIDE (PF) 0.9 % IJ SOLN
INTRAMUSCULAR | Status: DC | PRN
  Administered 2023-04-14: 25 mL

## 2023-04-14 MED ORDER — DEXAMETHASONE SODIUM PHOSPHATE 10 MG/ML IJ SOLN
INTRAMUSCULAR | Status: DC | PRN
  Administered 2023-04-14: 5 mg via INTRAVENOUS

## 2023-04-14 MED ORDER — EPHEDRINE SULFATE (PRESSORS) 50 MG/ML IJ SOLN
INTRAMUSCULAR | Status: DC | PRN
  Administered 2023-04-14: 5 mg via INTRAVENOUS
  Administered 2023-04-14: 10 mg via INTRAVENOUS

## 2023-04-14 MED ORDER — POLYETHYLENE GLYCOL 3350 17 GM/SCOOP PO POWD: 17.0000 g | Freq: Every day | ORAL | 0 refills | Status: DC

## 2023-04-14 MED ORDER — IBUPROFEN 600 MG PO TABS: 600.0000 mg | ORAL_TABLET | Freq: Four times a day (QID) | ORAL | 0 refills | Status: AC | PRN

## 2023-04-14 MED ORDER — OXYCODONE HCL 5 MG PO TABS
5.0000 mg | ORAL_TABLET | Freq: Once | ORAL | Status: AC | PRN
Start: 2023-04-14 — End: 2023-04-14
  Administered 2023-04-14: 5 mg via ORAL

## 2023-04-14 MED ORDER — PHENAZOPYRIDINE HCL 100 MG PO TABS
ORAL_TABLET | ORAL | Status: AC
  Filled 2023-04-14: qty 2

## 2023-04-14 MED ORDER — ACETAMINOPHEN 500 MG PO TABS
1000.0000 mg | ORAL_TABLET | ORAL | Status: AC
Start: 2023-04-14 — End: 2023-04-14
  Administered 2023-04-14: 1000 mg via ORAL

## 2023-04-14 MED ORDER — OXYCODONE HCL 5 MG/5ML PO SOLN: 5.0000 mg | Freq: Once | ORAL | Status: AC | PRN

## 2023-04-14 MED ORDER — STERILE WATER FOR IRRIGATION IR SOLN
Status: DC | PRN
  Administered 2023-04-14: 500 mL

## 2023-04-14 MED ORDER — CEFAZOLIN SODIUM-DEXTROSE 2-4 GM/100ML-% IV SOLN
INTRAVENOUS | Status: AC
  Filled 2023-04-14: qty 100

## 2023-04-14 MED ORDER — LIDOCAINE HCL (PF) 2 % IJ SOLN
INTRAMUSCULAR | Status: AC
  Filled 2023-04-14: qty 5

## 2023-04-14 MED ORDER — EPHEDRINE 5 MG/ML INJ
INTRAVENOUS | Status: AC
  Filled 2023-04-14: qty 5

## 2023-04-14 MED ORDER — PHENYLEPHRINE HCL (PRESSORS) 10 MG/ML IV SOLN
INTRAVENOUS | Status: DC | PRN
  Administered 2023-04-14: 160 ug via INTRAVENOUS
  Administered 2023-04-14: 40 ug via INTRAVENOUS

## 2023-04-14 MED ORDER — OXYCODONE HCL 5 MG PO TABS: 5.0000 mg | ORAL_TABLET | ORAL | 0 refills | Status: DC | PRN

## 2023-04-14 MED ORDER — ACETAMINOPHEN 500 MG PO TABS: 500.0000 mg | ORAL_TABLET | Freq: Four times a day (QID) | ORAL | 0 refills | Status: DC | PRN

## 2023-04-14 MED ORDER — FENTANYL CITRATE (PF) 100 MCG/2ML IJ SOLN
25.0000 ug | INTRAMUSCULAR | Status: DC | PRN
  Administered 2023-04-14: 50 ug via INTRAVENOUS
  Administered 2023-04-14 (×2): 25 ug via INTRAVENOUS

## 2023-04-14 MED ORDER — SODIUM CHLORIDE 0.9 % IR SOLN
Status: DC | PRN
  Administered 2023-04-14: 1000 mL via INTRAVESICAL

## 2023-04-14 MED ORDER — ACETAMINOPHEN 500 MG PO TABS
ORAL_TABLET | ORAL | Status: AC
  Filled 2023-04-14: qty 2

## 2023-04-14 MED ORDER — OXYCODONE HCL 5 MG PO TABS
ORAL_TABLET | ORAL | Status: AC
  Filled 2023-04-14: qty 1

## 2023-04-14 MED ORDER — CEFAZOLIN SODIUM-DEXTROSE 2-4 GM/100ML-% IV SOLN
2.0000 g | INTRAVENOUS | Status: AC
  Administered 2023-04-14: 2 g via INTRAVENOUS

## 2023-04-14 MED ORDER — SODIUM CHLORIDE 0.9 % IV SOLN: INTRAVENOUS | Status: DC

## 2023-04-14 MED ORDER — PROPOFOL 10 MG/ML IV BOLUS
INTRAVENOUS | Status: DC | PRN
  Administered 2023-04-14: 100 mg via INTRAVENOUS
  Administered 2023-04-14: 10 mg via INTRAVENOUS

## 2023-04-14 MED ORDER — LIDOCAINE 2% (20 MG/ML) 5 ML SYRINGE
INTRAMUSCULAR | Status: DC | PRN
  Administered 2023-04-14: 60 mg via INTRAVENOUS

## 2023-04-14 MED ORDER — FENTANYL CITRATE (PF) 100 MCG/2ML IJ SOLN
INTRAMUSCULAR | Status: AC
  Filled 2023-04-14: qty 2

## 2023-04-14 MED ORDER — GLYCOPYRROLATE PF 0.2 MG/ML IJ SOSY
PREFILLED_SYRINGE | INTRAMUSCULAR | Status: AC
  Filled 2023-04-14: qty 1

## 2023-04-14 SURGICAL SUPPLY — 49 items
BAG DRAIN URO-CYSTO SKYTR STRL (DRAIN) IMPLANT
BLADE CLIPPER SENSICLIP SURGIC (BLADE) IMPLANT
BLADE SURG 15 STRL LF DISP TIS (BLADE) ×3 IMPLANT
CATH FOLEY 2WAY SLVR 5CC 12FR (CATHETERS) ×3 IMPLANT
CATH ROBINSON RED A/P 12FR (CATHETERS) IMPLANT
CHLORAPREP W/TINT 26 (MISCELLANEOUS) IMPLANT
DEVICE CAPIO SLIM SINGLE (INSTRUMENTS) IMPLANT
DRAPE HYSTEROSCOPY (MISCELLANEOUS) ×3 IMPLANT
ELECT REM PT RETURN 9FT ADLT (ELECTROSURGICAL) ×3
ELECTRODE REM PT RTRN 9FT ADLT (ELECTROSURGICAL) IMPLANT
GAUZE 4X4 16PLY ~~LOC~~+RFID DBL (SPONGE) ×3 IMPLANT
GLOVE BIOGEL PI IND STRL 6.5 (GLOVE) ×3 IMPLANT
GLOVE BIOGEL PI IND STRL 7.0 (GLOVE) ×3 IMPLANT
GLOVE ECLIPSE 6.0 STRL STRAW (GLOVE) ×3 IMPLANT
GOWN STRL REUS W/TWL LRG LVL3 (GOWN DISPOSABLE) ×3 IMPLANT
HOLDER FOLEY CATH W/STRAP (MISCELLANEOUS) ×3 IMPLANT
IV NS 1000ML BAXH (IV SOLUTION) IMPLANT
KIT TURNOVER CYSTO (KITS) ×3 IMPLANT
MANIFOLD NEPTUNE II (INSTRUMENTS) ×3 IMPLANT
NDL HYPO 22X1.5 SAFETY MO (MISCELLANEOUS) ×3 IMPLANT
NDL MAYO 6 CRC TAPER PT (NEEDLE) IMPLANT
NEEDLE HYPO 22X1.5 SAFETY MO (MISCELLANEOUS) ×3
NEEDLE MAYO 6 CRC TAPER PT (NEEDLE)
NS IRRIG 1000ML POUR BTL (IV SOLUTION) ×3 IMPLANT
PACK CYSTO (CUSTOM PROCEDURE TRAY) ×3 IMPLANT
PACK VAGINAL WOMENS (CUSTOM PROCEDURE TRAY) ×3 IMPLANT
PAD OB MATERNITY 4.3X12.25 (PERSONAL CARE ITEMS) ×3 IMPLANT
RETRACTOR LONE STAR DISPOSABLE (INSTRUMENTS) ×3 IMPLANT
RETRACTOR STAY HOOK 5MM (MISCELLANEOUS) ×3 IMPLANT
SCRUB CHG 4% DYNA-HEX 4OZ (MISCELLANEOUS) ×3 IMPLANT
SET IRRIG Y TYPE TUR BLADDER L (SET/KITS/TRAYS/PACK) ×3 IMPLANT
SLEEVE SCD COMPRESS KNEE MED (STOCKING) ×3 IMPLANT
SPIKE FLUID TRANSFER (MISCELLANEOUS) IMPLANT
SURGIFLO W/THROMBIN 8M KIT (HEMOSTASIS) IMPLANT
SUT ABS MONO DBL WITH NDL 48IN (SUTURE) IMPLANT
SUT MON AB 2-0 SH 27 (SUTURE) IMPLANT
SUT VIC AB 0 CT1 27XBRD ANBCTR (SUTURE) IMPLANT
SUT VIC AB 0 CT1 27XCR 8 STRN (SUTURE) ×3 IMPLANT
SUT VIC AB 0 CT2 27 (SUTURE) IMPLANT
SUT VIC AB 0 CT2 8-18 (SUTURE) ×3 IMPLANT
SUT VIC AB 2-0 SH 27XBRD (SUTURE) IMPLANT
SUT VIC AB 3-0 SH 18 (SUTURE) IMPLANT
SUT VICRYL 2-0 SH 8X27 (SUTURE) ×3 IMPLANT
SYR BULB EAR ULCER 3OZ GRN STR (SYRINGE) ×3 IMPLANT
SYSTEM URETHRAL BULK BULKAMID (Female Continence) IMPLANT
TOWEL OR 17X24 6PK STRL BLUE (TOWEL DISPOSABLE) ×3 IMPLANT
TRAY FOLEY W/BAG SLVR 14FR LF (SET/KITS/TRAYS/PACK) ×3 IMPLANT
TUBE CONNECTING 12X1/4 (SUCTIONS) IMPLANT
WATER STERILE IRR 500ML POUR (IV SOLUTION) IMPLANT

## 2023-04-14 NOTE — Anesthesia Preprocedure Evaluation (Signed)
 Anesthesia Evaluation  Patient identified by MRN, date of birth, ID band Patient awake    Reviewed: Allergy & Precautions, H&P , NPO status , Patient's Chart, lab work & pertinent test results  Airway Mallampati: II   Neck ROM: full    Dental   Pulmonary shortness of breath, sleep apnea , former smoker   breath sounds clear to auscultation       Cardiovascular + dysrhythmias  Rhythm:regular Rate:Normal     Neuro/Psych  Headaches PSYCHIATRIC DISORDERS Anxiety Depression   Dementia  Neuromuscular disease    GI/Hepatic ,GERD  ,,Esophageal stricture   Endo/Other  diabetesHypothyroidism Hyperthyroidism   Renal/GU Renal InsufficiencyRenal disease     Musculoskeletal  (+) Arthritis ,    Abdominal   Peds  Hematology   Anesthesia Other Findings   Reproductive/Obstetrics                             Anesthesia Physical Anesthesia Plan  ASA: 3  Anesthesia Plan: General   Post-op Pain Management:    Induction: Intravenous  PONV Risk Score and Plan: 3 and Ondansetron , Dexamethasone  and Treatment may vary due to age or medical condition  Airway Management Planned: LMA  Additional Equipment:   Intra-op Plan:   Post-operative Plan: Extubation in OR  Informed Consent: I have reviewed the patients History and Physical, chart, labs and discussed the procedure including the risks, benefits and alternatives for the proposed anesthesia with the patient or authorized representative who has indicated his/her understanding and acceptance.     Dental advisory given  Plan Discussed with: CRNA, Anesthesiologist and Surgeon  Anesthesia Plan Comments:        Anesthesia Quick Evaluation

## 2023-04-14 NOTE — Discharge Instructions (Addendum)
 POST OPERATIVE INSTRUCTIONS  General Instructions Recovery (not bed rest) will last approximately 6 weeks Walking is encouraged, but refrain from strenuous exercise/ housework/ heavy lifting. No lifting >10lbs  Nothing in the vagina- NO intercourse, tampons or douching Bathing:  Do not submerge in water  (NO swimming, bath, hot tub, etc) until after your postop visit. You can shower starting the day after surgery.  No driving until you are not taking narcotic pain medicine and until your pain is well enough controlled that you can slam on the breaks or make sudden movements if needed.   Taking your medications Please take your acetaminophen  and ibuprofen  on a schedule for the first 48 hours. Take 600mg  ibuprofen , then take 500mg  acetaminophen  3 hours later, then continue to alternate ibuprofen  and acetaminophen . That way you are taking each type of medication every 6 hours. Take the prescribed narcotic (oxycodone , tramadol , etc) as needed, with a maximum being every 4 hours.  Take a stool softener daily to keep your stools soft and preventing you from straining. If you have diarrhea, you decrease your stool softener. This is explained more below. We have prescribed you Miralax . Place vaginal estrogen cream at the vaginal opening twice a week. Use your finger to apply.   Reasons to Call the Nurse (see last page for phone numbers) Heavy Bleeding (changing your pad every 1-2 hours) Persistent nausea/vomiting Fever (100.4 degrees or more) Incision problems (pus or other fluid coming out, redness, warmth, increased pain)  Things to Expect After Surgery Mild to Moderate pain is normal during the first day or two after surgery. If prescribed, take Ibuprofen  or Tylenol  first and use the stronger medicine for "break-through" pain. You can overlap these medicines because they work differently.   Constipation   To Prevent Constipation:  Eat a well-balanced diet including protein, grains, fresh fruit  and vegetables.  Drink plenty of fluids. Walk regularly.  Depending on specific instructions from your physician: take Miralax  daily and additionally you can add a stool softener (colace/ docusate) and fiber supplement. Continue as long as you're on pain medications.   To Treat Constipation:  If you do not have a bowel movement in 2 days after surgery, you can take 2 Tbs of Milk of Magnesia 1-2 times a day until you have a bowel movement. If diarrhea occurs, decrease the amount or stop the laxative. If no results with Milk of Magnesia, you can drink a bottle of magnesium  citrate which you can purchase over the counter.  Fatigue:  This is a normal response to surgery and will improve with time.  Plan frequent rest periods throughout the day.  Gas Pain:  This is very common but can also be very painful! Drink warm liquids such as herbal teas, bouillon or soup. Walking will help you pass more gas.  Mylicon or Gas-X can be taken over the counter.  Leaking Urine:  Varying amounts of leakage may occur after surgery.  This should improve with time. Your bladder needs at least 3 months to recover from surgery. If you leak after surgery, be sure to mention this to your doctor at your post-op visit. If you were taking medications for overactive bladder prior to surgery, be sure to restart the medications immediately after surgery.  Incisions: If you have incisions on your abdomen, the skin glue will dissolve on its own over time. It is ok to gently rinse with soap and water  over these incisions but do not scrub.  Catheter Approximately 50% of patients are unable  to urinate after surgery and need to go home with a catheter. This allows your bladder to rest so it can return to full function. If you go home with a catheter, the office will call to set up a voiding trial a few days after surgery. For most patients, by this visit, they are able to urinate on their own. Long term catheter use is rare.   Return to  Work  As work demands and recovery times vary widely, it is hard to predict when you will want to return to work. If you have a desk job with no strenuous physical activity, and if you would like to return sooner than generally recommended, discuss this with your provider or call our office.   Post op concerns  For non-emergent issues, please call the Urogynecology Nurse. Please leave a message and someone will contact you within one business day.  You can also send a message through MyChart.   AFTER HOURS (After 5:00 PM and on weekends):  For urgent matters that cannot wait until the next business day. Call our office [phone] and connect to the doctor on call.  Please reserve this for important issues.   **FOR ANY TRUE EMERGENCY ISSUES CALL 911 OR GO TO THE NEAREST EMERGENCY ROOM.** Please inform our office or the doctor on call of any emergency.     APPOINTMENTS: Call [phone]  Post Anesthesia Home Care Instructions  Activity: Get plenty of rest for the remainder of the day. A responsible adult should stay with you for 24 hours following the procedure.  For the next 24 hours, DO NOT: -Drive a car -Advertising copywriter -Drink alcoholic beverages -Take any medication unless instructed by your physician -Make any legal decisions or sign important papers.  Meals: Start with liquid foods such as gelatin or soup. Progress to regular foods as tolerated. Avoid greasy, spicy, heavy foods. If nausea and/or vomiting occur, drink only clear liquids until the nausea and/or vomiting subsides. Call your physician if vomiting continues.  Special Instructions/Symptoms: Your throat may feel dry or sore from the anesthesia or the breathing tube placed in your throat during surgery. If this causes discomfort, gargle with warm salt water . The discomfort should disappear within 24 hours.

## 2023-04-14 NOTE — Transfer of Care (Signed)
 Immediate Anesthesia Transfer of Care Note  Patient: Brianna [name]  Procedure(s) Performed: Procedure(s) (LRB): COLPOCLEISIS (N/A) levator plication with perineorrhaphy (N/A) URETHRAL BULKING (N/A) CYSTOSCOPY (N/A)  Patient Location: PACU  Anesthesia Type: General  Level of Consciousness: awake, alert  and oriented  Airway & Oxygen Therapy: Patient Spontanous Breathing and Patient connected to nasal cannula oxygen  Post-op Assessment: Report given to PACU RN and Post -op Vital signs reviewed and stable  Post vital signs: Reviewed and stable  Complications: No apparent anesthesia complications  Last Vitals:  Vitals Value Taken Time  BP 148/73 04/14/23 1246  Temp    Pulse 82 04/14/23 1248  Resp 15 04/14/23 1248  SpO2 97 % 04/14/23 1248  Vitals shown include unfiled device data.  Last Pain:  Vitals:   04/14/23 0924  TempSrc: Oral  PainSc: 0-No pain      Patients Stated Pain Goal: 5 (04/14/23 0924)  Complications: No notable events documented.

## 2023-04-14 NOTE — OR Nursing (Signed)
 Bladder scanned foe 108cc.  Orders received from Dr. Florian Buff patient doesn't have to go home with a foley cathetor.  Margarita Mail rn

## 2023-04-14 NOTE — Op Note (Addendum)
 Operative Note  Preoperative Diagnosis: anterior vaginal prolapse, posterior vaginal prolapse, vaginal vault prolapse after hysterectomy, and stress urinary incontinence  Postoperative Diagnosis: same  Procedures performed:  Colpocleisis, levator plication, perineorrhaphy, cystoscopy, urethral bulking (Bulkamid)  Implants:  Implant Name Type Inv. Item Serial No. Manufacturer Lot No. LRB No. Used Action  SYSTEM PHILL COLVIN LUEVENIA GLENWOOD [id number] Female Continence SYSTEM PHILL COLVIN LUEVENIA [id number] [employer]IL [id number] N/A 1 Implanted    Attending Surgeon: Rosaline Caper, MD  Anesthesia: General LMA  Findings: 1. On vaginal exam, complete eversion of vaginal cuff present.   2. On cystoscopy, normal bladder and urethra without injury or lesion. Brisk bilateral ureteral efflux present. Good coaptation of the urethra at the end of the case.    Specimens: none  Estimated blood loss: 50 mL  IV fluids: 700 mL  Urine output: 100 mL  Complications: none  Procedure in Detail:  After informed consent was obtained, the patient was taken to the operating room where she was placed under anesthesia.  She was then placed in the dorsal lithotomy position, taking care to avoid traction on the extremities. She was then prepped and draped in the usual sterile fashion.  A self-retaining lonestar retractor was placed using four elastic blue stays.  After a foley catheter was inserted into the urethra, the location of the midurethra was palpated. Two Allis clamps were placed at the vaginal apex. 0.5% lidocaine  with epinephrine  was injected into the vaginal mucosa circumferentially.  A marking pen was used to delineate 4 quadrants of vaginal mucosa to remove.  An incision was made with a 15 blade scalpel along these marked lines. After an Allis clamp was placed on the vaginal mucosa, the underlying vaginal muscularis was dissected off the mucosa.  The vaginal mucosa was excised off each  quadrant. Excellent hemostasis was obtained with cautery.  Serial pursestring sutures were used to reduce the vaginal vault prolapse using 2-0 vicryl. Anterior and posterior plication sutures were placed to reapproximate the epithelium with 0- vicryl suture. The mucosa was then closed with a 0-vicryl suture in a running fashion.    The Foley catheter was removed.  A 70-degree cystoscope was introduced, and 360-degree inspection revealed no trauma to the bladder, with bilateral ureteral efflux.  The bladder was drained and the cystoscope was removed.  The Foley catheter was reinserted.   Attention was then turned to the posterior vagina and perineum.  Two Allis clamps were placed at the level of the hymenal ring and 0.5% lidocaine  with epinephrine  was injected into the vaginal mucosa. A diamond shaped incision was made over the perineum and posterior vaginal wall with a 15 blade scalpel, and the epithelium was removed.  The underlying tissue was then dissected off of the epithelium bilaterally with Metzenbaum scissors.  A 0-Vicryl suture was used to plicate the levator muscles to the midline. Two 0- vicryl interrupted sutures were used to reapproximate the perineal body. A 2-0 vicryl suture was then used to close the epithelium in a subcutaneous and running fashion.  The vagina was copiously irrigated.  Hemostasis was noted.   The foley was removed.  A 0 degree Bulkamid cystoscope was inserted into the urethra into the bladder.  The cystoscope was inserted to the level of the bladder neck.  The needle was inserted 2 cm and the scope was pulled back into the urethra 2 cm.  The needle was inserted bevel up at the 5 o'clock position and the Bulkamid was injected to obtain coaptation.  This was repeated at the 2 o'clock,  10 o'clock and 7 o'clock positions.   A total of 2- 1ml syringes were used and good circumferential coaptation was noted. A 12 F foley was placed. The patient tolerated the procedure well was  taken to recovery room in stable condition. All counts were correct x2.  Rosaline LOISE Caper, MD

## 2023-04-14 NOTE — Interval H&P Note (Signed)
 History and Physical Interval Note:  04/14/2023 10:31 AM  Brianna [name]  has presented today for surgery, with the diagnosis of prolapse of vaginal vault after hysterectomy; stress urinary incontinence.  The various methods of treatment have been discussed with the patient and family. After consideration of risks, benefits and other options for treatment, the patient has consented to  Procedure(s) with comments: COLPOCLEISIS (N/A) , LEVATOR PLICATION AND PERINEORRHAPHY, URETHRAL BULKING (N/A) CYSTOSCOPY (N/A) as a surgical intervention.  The patient's history has been reviewed, patient examined, no change in status, stable for surgery.  I have reviewed the patient's chart and labs.  Questions were answered to the patient's satisfaction.     Rosaline LOISE Caper

## 2023-04-14 NOTE — Anesthesia Procedure Notes (Signed)
 Procedure Name: LMA Insertion Date/Time: 04/14/2023 11:13 AM  Performed by: Dena Grayce MATSU, CRNAPre-anesthesia Checklist: Patient identified, Emergency Drugs available, Suction available and Patient being monitored Patient Re-evaluated:Patient Re-evaluated prior to induction Oxygen Delivery Method: Circle system utilized Preoxygenation: Pre-oxygenation with 100% oxygen Induction Type: IV induction Ventilation: Mask ventilation without difficulty LMA: LMA inserted LMA Size: 3.0 Number of attempts: 1 Airway Equipment and Method: Bite block Placement Confirmation: positive ETCO2 Tube secured with: Tape Dental Injury: Teeth and Oropharynx as per pre-operative assessment

## 2023-04-15 ENCOUNTER — Telehealth: Payer: Self-pay | Admitting: Obstetrics and Gynecology

## 2023-04-15 ENCOUNTER — Encounter (HOSPITAL_BASED_OUTPATIENT_CLINIC_OR_DEPARTMENT_OTHER): Payer: Self-pay | Admitting: Obstetrics and Gynecology

## 2023-04-15 NOTE — Telephone Encounter (Signed)
 Brianna [name] underwent Colpocleisis, levator plication, perineorrhaphy, cystoscopy, urethral bulking (Bulkamid) on 04/14/23.   She passed her voiding trial.  was backfilled into the bladder Voided  PVR by bladder scan was .   She was discharged without a catheter. Please call her for a routine post op check. Thanks!  Rosaline LOISE Caper, MD

## 2023-04-15 NOTE — Telephone Encounter (Signed)
 Post- Op Call  Adore A Kavanaugh underwent COLPOCLEISIS (Vagina )  levator plication with perineorrhaphy (Vagina )  URETHRAL BULKING (Urethra)  CYSTOSCOPY (Bladder) on 04/14/23 with Dr Marilynne. The patient reports that her pain is controlled. She is taking ibuprofen . She very light vaginal bleeding. She has not had a bowel movement and is taking Miralax  for a bowel regimen. She was discharged without a catheter and will return from her post op.  Garrie LITTIE Evangelist, CMA

## 2023-04-18 ENCOUNTER — Encounter (HOSPITAL_COMMUNITY): Payer: Self-pay | Admitting: Psychiatry

## 2023-04-18 ENCOUNTER — Telehealth (HOSPITAL_COMMUNITY): Payer: PPO | Admitting: Psychiatry

## 2023-04-18 DIAGNOSIS — F411 Generalized anxiety disorder: Secondary | ICD-10-CM

## 2023-04-18 DIAGNOSIS — F331 Major depressive disorder, recurrent, moderate: Secondary | ICD-10-CM

## 2023-04-18 NOTE — Progress Notes (Signed)
 [facility] Follow up visit  Patient Identification: Brianna [name] MRN:  [id number] Date of Evaluation:  04/18/2023 Referral Source: primary care Chief Complaint:   No chief complaint on file. Follow up depression  Visit Diagnosis:    ICD-10-CM   1. MDD (major depressive disorder), recurrent episode, moderate (HCC)  F33.1     2. GAD (generalized anxiety disorder)  F41.1      Virtual Visit via Video Note  I connected with Brianna [name] on 04/18/23 at 11:30 AM EST by a video enabled telemedicine application and verified that I am speaking with the correct person using two identifiers.  Location: Patient: home Provider: home office   I discussed the limitations of evaluation and management by telemedicine and the availability of in person appointments. The patient expressed understanding and agreed to proceed.      I discussed the assessment and treatment plan with the patient. The patient was provided an opportunity to ask questions and all were answered. The patient agreed with the plan and demonstrated an understanding of the instructions.   The patient was advised to call back or seek an in-person evaluation if the symptoms worsen or if the condition fails to improve as anticipated.  I provided 18 minutes of non-face-to-face time during this encounter.     History of Present Illness: Patient is a 78 years old currently white Caucasian female referred initially by primary care physician and herself to establish care for her depression and anxiety patient currently lives by herself she has a caregiver  Patient gives a complicated long history of multiple hospitalization , ECT in past admission for depression and suicidal ideation last was a month ago according to her she was feeling down depressed and suicidal this was after a trauma incident related to her son who moved out to [place]  along with his daughter and that was traumatizing.  On eval today doing fair, went thru GI  surgery doing well, daughter and son is supportive Sleep and depression manageable    Aggravating factors; trauma incident on last year when her son left her for home she bought a house to live together. Being widow  Modifying factors: has a caregive, family ,daughter, one son  Duration thru her adult life Severity  fair  Previous Psychotropic Medications: Yes  Most meds used including ellavil in 1975, lexapro  Substance Abuse History in the last 12 months:  Yes.    Consequences of Substance Abuse: One drink a day . Last used one month ago. Discussed risk of depression, memory  Past Medical History:  Past Medical History:  Diagnosis Date   Allergy    Anemia    Attention deficit disorder with hyperactivity(314.01)    Complication of anesthesia    shaking after sugery in the 1980's   Dementia (HCC)    Depressive disorder, not elsewhere classified    Diabetes mellitus without complication (HCC)    lost weight and no longer has the issue with blood sugars   Dysrhythmia    Esophageal reflux    GERD (gastroesophageal reflux disease)    HLD (hyperlipidemia)    Hyperthyroidism    per patient   Insomnia    Kidney disease    stage 3 per pt   Kidney stones    Lung tumor 2018   Migraine    Neuromuscular disorder (HCC)    Obstructive sleep apnea    Osteoarthritis    Osteoporosis    Other and unspecified hyperlipidemia    Other chronic  cystitis    Panic attacks    Posttraumatic stress disorder    followed by Dr Waddell, psychiatry   Sleep apnea    Stricture and stenosis of esophagus    Temporomandibular joint disorders, unspecified    Thoracic spondylosis without myelopathy    Unspecified hypothyroidism     Past Surgical History:  Procedure Laterality Date   ABDOMINAL HYSTERECTOMY     APPENDECTOMY     CHOLECYSTECTOMY     COLONOSCOPY     COLPOCLEISIS N/A 04/14/2023   Procedure: COLPOCLEISIS;  Surgeon: Marilynne Rosaline SAILOR, MD;  Location: [facility];   Service: Gynecology;  Laterality: N/A;   CYSTORRHAPHY     bladder tack   CYSTOSCOPY N/A 04/14/2023   Procedure: CYSTOSCOPY;  Surgeon: Marilynne Rosaline SAILOR, MD;  Location: [facility];  Service: Gynecology;  Laterality: N/A;   EYE SURGERY Right    LUMBAR LAMINECTOMY/DECOMPRESSION MICRODISCECTOMY Right 01/28/2013   Procedure: LUMBAR LAMINECTOMY/DECOMPRESSION MICRODISCECTOMY LUMBAR FOUR-FIVE;  Surgeon: Darina MALVA Boehringer, MD;  Location: [facility];  Service: Neurosurgery;  Laterality: Right;   RECTOCELE REPAIR N/A 04/14/2023   Procedure: levator plication with perineorrhaphy;  Surgeon: Marilynne Rosaline SAILOR, MD;  Location: [facility];  Service: Gynecology;  Laterality: N/A;   TONSILLECTOMY     TRANSFORAMINAL LUMBAR INTERBODY FUSION (TLIF) WITH PEDICLE SCREW FIXATION 1 LEVEL Right 11/25/2013   Procedure: TRANSFORAMINAL LUMBAR INTERBODY FUSION (TLIF) WITH PEDICLE SCREW FIXATION 1 LEVEL;  Surgeon: Darina MALVA Boehringer, MD;  Location: [facility];  Service: Neurosurgery;  Laterality: Right;  TRANSFORAMINAL LUMBAR INTERBODY FUSION (TLIF) WITH PEDICLE SCREW FIXATION 1 LEVEL LUMBAR 4-5   TUBAL LIGATION        Family History:  Family History  Problem Relation Age of Onset   Heart attack Father    Heart failure Father    Liver disease Father    Heart disease Father    COPD Mother    Depression Mother    Diabetes Mother    Crohn's disease Son    Colon cancer Neg Hx    Colon polyps Neg Hx    Esophageal cancer Neg Hx    Rectal cancer Neg Hx    Stomach cancer Neg Hx     Social History:   Social History   Socioeconomic History   Marital status: Widowed    Spouse name: Not on file   Number of children: 4   Years of education: Not on file   Highest education level: Not on file  Occupational History   Occupation: retired    Associate Professor: UNEMPLOYED  Tobacco Use   Smoking status: Former    Current packs/day: 0.50    Average packs/day: 0.5 packs/day for 20.0 years  (10.0 ttl pk-yrs)    Types: Cigarettes   Smokeless tobacco: Never  Vaping Use   Vaping status: Never Used  Substance and Sexual Activity   Alcohol use: No    Alcohol/week: 0.0 standard drinks of alcohol   Drug use: No   Sexual activity: Not Currently    Birth control/protection: Surgical  Other Topics Concern   Not on file  Social History Narrative   Pt drinks two cups of coffee daily.  And does praise and worship daily   Social Drivers of Health   Financial Resource Strain: Not on file  Food Insecurity: Low Risk  (05/27/2022)   [facility]   Hunger Vital Sign    Worried About Running Out of Food in the  Last Year: Never true    Within the past 12 months, the food you bought just didn't last and you didn't have money to get more: Not on file  Transportation Needs: Unmet Transportation Needs (05/27/2022)   [facility]   Transportation    In the past 12 months, has lack of reliable transportation kept you from medical appointments, meetings, work or from getting things needed for daily living? : Yes  Physical Activity: Not on file  Stress: Not on file  Social Connections: Not on file     Allergies:   Allergies  Allergen Reactions   Tetracycline Swelling    Metabolic Disorder Labs: No results found for: HGBA1C, MPG No results found for: PROLACTIN No results found for: CHOL, TRIG, HDL, CHOLHDL, VLDL, LDLCALC Lab Results  Component Value Date   TSH 11.692 (H) 07/22/2015    Therapeutic Level Labs: No results found for: LITHIUM No results found for: CBMZ No results found for: VALPROATE  Current Medications: Current Outpatient Medications  Medication Sig Dispense Refill   acetaminophen  (TYLENOL ) 500 MG tablet Take 1 tablet (500 mg total) by mouth every 6 (six) hours as needed (pain). 30 tablet 0   busPIRone  (BUSPAR ) 7.5 MG tablet Take 1 tablet (7.5 mg total) by mouth 2 (two) times  daily. 60 tablet 1   estradiol  (ESTRACE ) 0.1 MG/GM vaginal cream Use your finger to place a pea sized amount at the vaginal opening twice a week 42.5 g 1   ibuprofen  (ADVIL ) 600 MG tablet Take 1 tablet (600 mg total) by mouth every 6 (six) hours as needed. 30 tablet 0   levothyroxine  (SYNTHROID , LEVOTHROID) 75 MCG tablet Take 1 tablet (75 mcg total) by mouth daily. 30 tablet 0   mirtazapine  (REMERON ) 15 MG tablet Take 1 tablet (15 mg total) by mouth at bedtime. 30 tablet 2   oxyCODONE  (OXY IR/ROXICODONE ) 5 MG immediate release tablet Take 1 tablet (5 mg total) by mouth every 4 (four) hours as needed for severe pain (pain score 7-10). 15 tablet 0   polyethylene glycol powder (GLYCOLAX /MIRALAX ) 17 GM/SCOOP powder Take 17 g by mouth daily. Drink 17g (1 scoop) dissolved in water  per day. 255 g 0   timolol (TIMOPTIC) 0.5 % ophthalmic solution Place 1 drop into both eyes 2 (two) times daily.  2   zinc gluconate 50 MG tablet Take 50 mg by mouth daily.     No current facility-administered medications for this visit.     Psychiatric Specialty Exam: Review of Systems  Cardiovascular:  Negative for chest pain.  Psychiatric/Behavioral:  Negative for agitation.     There were no vitals taken for this visit.There is no height or weight on file to calculate BMI.  General Appearance: Casual  Eye Contact:  Fair  Speech:  Slow  Volume:  Normal  Mood:  fair  Affect:  Constricted  Thought Process:  Goal Directed  Orientation:  Full (Time, Place, and Person)  Thought Content:  Rumination  Suicidal Thoughts:  No  Homicidal Thoughts:  No  Memory:  Immediate;   Fair  Judgement:  Fair  Insight:  Fair  Psychomotor Activity:  Normal  Concentration:  Concentration: Fair  Recall:  [employer] of Knowledge:Fair  Language: Fair  Akathisia:  No  Handed:    AIMS (if indicated):  not done  Assets:  Desire for Improvement Financial Resources/Insurance  ADL's:  Intact  Cognition: WNL  Sleep:  Fair    Screenings: PHQ2-9  Flowsheet Row Office Visit from 06/28/2022 in [facility] at [facility] Patient Outreach Telephone from 09/09/2016 in [facility]  PHQ-2 Total Score 1 0      Flowsheet Row Admission (Discharged) from 04/14/2023 in [facility] Office Visit from 06/28/2022 in [facility] at [facility]  C-SSRS RISK CATEGORY No Risk No Risk       Assessment and Plan: as follows  Prior documentation reviewed  Major depressive disorder recurrent severe; manageable continue remeron  handling depression well  Generalized anxiety disorder; doing fair, buspar  helps continue tid    Insomnia; sleep hygiene reviewed, continue remeron    Fu 3- 4 m. Renewed meds    Collaboration of Care: Primary Care Provider AEB reviewed notes and chart   Patient/Guardian was advised Release of Information must be obtained prior to any record release in order to collaborate their care with an outside provider. Patient/Guardian was advised if they have not already done so to contact the registration department to sign all necessary forms in order for us  to release information regarding their care.   Consent: Patient/Guardian gives verbal consent for treatment and assignment of benefits for services provided during this visit. Patient/Guardian expressed understanding and agreed to proceed.   Jackey Flight, MD 1/10/202511:31 AM

## 2023-04-18 NOTE — Anesthesia Postprocedure Evaluation (Signed)
 Anesthesia Post Note  Patient: Brianna [name]  Procedure(s) Performed: COLPOCLEISIS (Vagina ) levator plication with perineorrhaphy (Vagina ) URETHRAL BULKING (Urethra) CYSTOSCOPY (Bladder)     Patient location during evaluation: PACU Anesthesia Type: General Level of consciousness: awake and alert Pain management: pain level controlled Vital Signs Assessment: post-procedure vital signs reviewed and stable Respiratory status: spontaneous breathing, nonlabored ventilation, respiratory function stable and patient connected to nasal cannula oxygen Cardiovascular status: blood pressure returned to baseline and stable Postop Assessment: no apparent nausea or vomiting Anesthetic complications: no   No notable events documented.  Last Vitals:  Vitals:   04/14/23 1345 04/14/23 1600  BP: (!) 145/63 (!) 140/74  Pulse: 80 84  Resp: (!) 21 19  Temp:    SpO2: 96% 97%    Last Pain:  Vitals:   04/14/23 0924  TempSrc: Oral  PainSc: 0-No pain                 Shulamit Donofrio S

## 2023-05-28 ENCOUNTER — Ambulatory Visit (INDEPENDENT_AMBULATORY_CARE_PROVIDER_SITE_OTHER): Payer: PPO | Admitting: Obstetrics and Gynecology

## 2023-05-28 ENCOUNTER — Other Ambulatory Visit (HOSPITAL_COMMUNITY)
Admission: RE | Admit: 2023-05-28 | Discharge: 2023-05-28 | Disposition: A | Discharge status: Home / Self Care | Payer: PPO | Source: Other Acute Inpatient Hospital | Attending: Obstetrics and Gynecology | Admitting: Obstetrics and Gynecology

## 2023-05-28 ENCOUNTER — Encounter: Payer: Self-pay | Admitting: Obstetrics and Gynecology

## 2023-05-28 VITALS — BP 129/81 | HR 71

## 2023-05-28 DIAGNOSIS — R82998 Other abnormal findings in urine: Secondary | ICD-10-CM | POA: Diagnosis present

## 2023-05-28 DIAGNOSIS — Z48816 Encounter for surgical aftercare following surgery on the genitourinary system: Secondary | ICD-10-CM

## 2023-05-28 DIAGNOSIS — R350 Frequency of micturition: Secondary | ICD-10-CM

## 2023-05-28 DIAGNOSIS — N390 Urinary tract infection, site not specified: Secondary | ICD-10-CM | POA: Diagnosis not present

## 2023-05-28 DIAGNOSIS — Z9889 Other specified postprocedural states: Secondary | ICD-10-CM

## 2023-05-28 LAB — URINALYSIS, ROUTINE W REFLEX MICROSCOPIC
Bilirubin Urine: NEGATIVE
Glucose, UA: NEGATIVE mg/dL
Ketones, ur: NEGATIVE mg/dL
Nitrite: NEGATIVE
Protein, ur: NEGATIVE mg/dL
Specific Gravity, Urine: 1.006 (ref 1.005–1.030)
WBC, UA: >50 WBC/hpf (ref 0–5)
pH: 6 (ref 5.0–8.0)

## 2023-05-28 LAB — POCT URINALYSIS DIPSTICK
Bilirubin, UA: NEGATIVE
Glucose, UA: NEGATIVE
Ketones, UA: NEGATIVE
Nitrite, UA: NEGATIVE
Protein, UA: NEGATIVE
Spec Grav, UA: <=1.005 — AB (ref 1.010–1.025)
Urobilinogen, UA: 0.2 U/dL
pH, UA: 6 (ref 5.0–8.0)

## 2023-05-28 MED ORDER — SULFAMETHOXAZOLE-TRIMETHOPRIM 800-160 MG PO TABS: 1.0000 | ORAL_TABLET | Freq: Two times a day (BID) | ORAL | 0 refills | Status: AC

## 2023-05-28 NOTE — Progress Notes (Signed)
[facility]  Date of Visit: 05/28/2023  History of Present Illness: Ms. Pipkins is a 78 y.o. female scheduled today for a post-operative visit.   Surgery: s/p Colpocleisis, levator plication, perineorrhaphy, cystoscopy, urethral bulking (Bulkamid) on 04/13/22  She passed her postoperative void trial.   Postoperative course has been uncomplicated.   Today she reports she has a little discomfort when she starts to go to the bathroom. She did have the flu a few weeks ago. Has used vaginal estrogen a few times but has been forgetting.   UTI in the last 6 weeks?  Current symptoms Pain? No  She has returned to her normal activity (except for postop restrictions) Vaginal bulge? No  Stress incontinence: No  Urgency/frequency: No  Urge incontinence: No  Voiding dysfunction: No  Bowel issues: No   Subjective Success: Do you usually have a bulge or something falling out that you can see or feel in the vaginal area? No  Retreatment Success: Any retreatment with surgery or pessary for any compartment? No    Medications: She has a current medication list which includes the following prescription(s): buspirone, estradiol, ibuprofen, levothyroxine, sulfamethoxazole-trimethoprim, timolol, zinc gluconate, and mirtazapine.   Allergies: Patient is allergic to tetracycline.   Physical Exam: BP 129/81   Pulse 71    Pelvic Examination: perineum well healed. Vagina: incisions well healed. Sutures are not present at incision line and there is not granulation tissue. No tenderness.  POP-Q: POP-Q  -2                                            Aa   -2                                           Ba  -2                                              C   1.5                                            Gh  4.5                                            Pb  2                                            tvl   -2                                            Ap  -2  Bp                                                 D   POC urine: moderate leukocytes, trace blood, negative nitrites ---------------------------------------------------------  Assessment and Plan:  1. Leukocytes in urine   2. Urinary frequency    - Well healed  - Can resume regular activity including exercise.  - Discussed avoidance of heavy lifting and straining long term to reduce the risk of recurrence.  - Discussed that Bulkamid will last a few years but will likely need retreatment once the bulking agent gets resorbed.  - will treat UTI with bactrim DS BID x 3 days. Will send urine for culture as well.   Return as needed  Marguerita Beards, MD

## 2023-05-30 ENCOUNTER — Other Ambulatory Visit (HOSPITAL_COMMUNITY): Payer: Self-pay | Admitting: Psychiatry

## 2023-05-30 LAB — URINE CULTURE: Culture: >=100000 — AB

## 2023-05-30 MED ORDER — NITROFURANTOIN MONOHYD MACRO 100 MG PO CAPS: 100.0000 mg | ORAL_CAPSULE | Freq: Two times a day (BID) | ORAL | 0 refills | Status: AC

## 2023-05-30 NOTE — Addendum Note (Signed)
Addended by: Marguerita Beards on: 05/30/2023 11:47 AM   Modules accepted: Orders

## 2023-05-30 NOTE — Progress Notes (Signed)
Pt was contacted and notified of the message from Dr. Florian Buff. Pt verbalized understanding

## 2023-06-01 LAB — COLOGUARD: COLOGUARD: NEGATIVE

## 2023-06-30 ENCOUNTER — Other Ambulatory Visit (HOSPITAL_COMMUNITY): Payer: Self-pay | Admitting: Psychiatry

## 2023-07-18 ENCOUNTER — Telehealth (HOSPITAL_COMMUNITY): Payer: PPO | Admitting: Psychiatry

## 2023-07-18 ENCOUNTER — Encounter (HOSPITAL_COMMUNITY): Payer: Self-pay | Admitting: Psychiatry

## 2023-07-18 DIAGNOSIS — F331 Major depressive disorder, recurrent, moderate: Secondary | ICD-10-CM | POA: Diagnosis not present

## 2023-07-18 DIAGNOSIS — F411 Generalized anxiety disorder: Secondary | ICD-10-CM

## 2023-07-18 MED ORDER — BUSPIRONE HCL 7.5 MG PO TABS: 7.5000 mg | ORAL_TABLET | Freq: Three times a day (TID) | ORAL | 1 refills | Status: DC

## 2023-07-18 NOTE — Progress Notes (Signed)
 [facility] Follow up visit  Patient Identification: Brianna [name] MRN:  [id number] Date of Evaluation:  07/18/2023 Referral Source: primary care Chief Complaint:   No chief complaint on file. Follow up depression  Visit Diagnosis:    ICD-10-CM   1. MDD (major depressive disorder), recurrent episode, moderate (HCC)  F33.1     2. GAD (generalized anxiety disorder)  F41.1      Virtual Visit via Video Note  I connected with Brianna [name] on 07/18/23 at 10:00 AM EDT by a video enabled telemedicine application and verified that I am speaking with the correct person using two identifiers.  Location: Patient: home Provider: home office   I discussed the limitations of evaluation and management by telemedicine and the availability of in person appointments. The patient expressed understanding and agreed to proceed.      I discussed the assessment and treatment plan with the patient. The patient was provided an opportunity to ask questions and all were answered. The patient agreed with the plan and demonstrated an understanding of the instructions.   The patient was advised to call back or seek an in-person evaluation if the symptoms worsen or if the condition fails to improve as anticipated.  I provided 18 minutes of non-face-to-face time during this encounter.     History of Present Illness: Patient is a 78 years old currently white Caucasian female referred initially by primary care physician and herself to establish care for her depression and anxiety patient currently lives by herself she has a caregiver  Patient gives a complicated long history of multiple hospitalization , ECT in past admission for depression and suicidal ideation last was a month ago according to her she was feeling down depressed and suicidal this was after a trauma incident related to her son who moved out to [place] along with his daughter and that was traumatizing.  On eval was doing fair last visit but  now noticing anxiety for no reason Says no particular stress and things are better with son, financially and is doing art class   Aggravating factors; trauma incident on last year when her son left her for home she bought a house to live together. Being widow  Modifying factors: has a caregive, family ,daughter, son  Duration thru her adult life Severity  some anxiety   Previous Psychotropic Medications: Yes  Most meds used including ellavil in 1975, lexapro  Substance Abuse History in the last 12 months:  Yes.    Consequences of Substance Abuse: One drink a day . Last used one month ago. Discussed risk of depression, memory  Past Medical History:  Past Medical History:  Diagnosis Date   Allergy    Anemia    Attention deficit disorder with hyperactivity(314.01)    Complication of anesthesia    shaking after sugery in the 1980's   Dementia (HCC)    Depressive disorder, not elsewhere classified    Diabetes mellitus without complication (HCC)    lost weight and no longer has the issue with blood sugars   Dysrhythmia    Esophageal reflux    GERD (gastroesophageal reflux disease)    HLD (hyperlipidemia)    Hyperthyroidism    per patient   Insomnia    Kidney disease    stage 3 per pt   Kidney stones    Lung tumor 2018   Migraine    Neuromuscular disorder (HCC)    Obstructive sleep apnea    Osteoarthritis    Osteoporosis  Other and unspecified hyperlipidemia    Other chronic cystitis    Panic attacks    Posttraumatic stress disorder    followed by Dr Ladona Ridgel, psychiatry   Sleep apnea    Stricture and stenosis of esophagus    Temporomandibular joint disorders, unspecified    Thoracic spondylosis without myelopathy    Unspecified hypothyroidism     Past Surgical History:  Procedure Laterality Date   ABDOMINAL HYSTERECTOMY     APPENDECTOMY     CHOLECYSTECTOMY     COLONOSCOPY     COLPOCLEISIS N/A 04/14/2023   Procedure: COLPOCLEISIS;  Surgeon: Marguerita Beards, MD;  Location: [facility];  Service: Gynecology;  Laterality: N/A;   CYSTORRHAPHY     "bladder tack"   CYSTOSCOPY N/A 04/14/2023   Procedure: CYSTOSCOPY;  Surgeon: Marguerita Beards, MD;  Location: [facility];  Service: Gynecology;  Laterality: N/A;   EYE SURGERY Right    LUMBAR LAMINECTOMY/DECOMPRESSION MICRODISCECTOMY Right 01/28/2013   Procedure: LUMBAR LAMINECTOMY/DECOMPRESSION MICRODISCECTOMY LUMBAR FOUR-FIVE;  Surgeon: Reinaldo Meeker, MD;  Location: [facility];  Service: Neurosurgery;  Laterality: Right;   RECTOCELE REPAIR N/A 04/14/2023   Procedure: levator plication with perineorrhaphy;  Surgeon: Marguerita Beards, MD;  Location: [facility];  Service: Gynecology;  Laterality: N/A;   TONSILLECTOMY     TRANSFORAMINAL LUMBAR INTERBODY FUSION (TLIF) WITH PEDICLE SCREW FIXATION 1 LEVEL Right 11/25/2013   Procedure: TRANSFORAMINAL LUMBAR INTERBODY FUSION (TLIF) WITH PEDICLE SCREW FIXATION 1 LEVEL;  Surgeon: Reinaldo Meeker, MD;  Location: [facility];  Service: Neurosurgery;  Laterality: Right;  TRANSFORAMINAL LUMBAR INTERBODY FUSION (TLIF) WITH PEDICLE SCREW FIXATION 1 LEVEL LUMBAR 4-5   TUBAL LIGATION        Family History:  Family History  Problem Relation Age of Onset   Heart attack Father    Heart failure Father    Liver disease Father    Heart disease Father    COPD Mother    Depression Mother    Diabetes Mother    Crohn's disease Son    Colon cancer Neg Hx    Colon polyps Neg Hx    Esophageal cancer Neg Hx    Rectal cancer Neg Hx    Stomach cancer Neg Hx     Social History:   Social History   Socioeconomic History   Marital status: Widowed    Spouse name: Not on file   Number of children: 4   Years of education: Not on file   Highest education level: Not on file  Occupational History   Occupation: retired    Associate Professor: UNEMPLOYED  Tobacco Use   Smoking status: Former    Current packs/day: 0.50     Average packs/day: 0.5 packs/day for 20.0 years (10.0 ttl pk-yrs)    Types: Cigarettes   Smokeless tobacco: Never  Vaping Use   Vaping status: Never Used  Substance and Sexual Activity   Alcohol use: No    Alcohol/week: 0.0 standard drinks of alcohol   Drug use: No   Sexual activity: Not Currently    Birth control/protection: Surgical  Other Topics Concern   Not on file  Social History Narrative   Pt drinks two cups of coffee daily.  And does praise and worship daily   Social Drivers of Health   Financial Resource Strain: Not on file  Food Insecurity: Low Risk  (05/27/2022)   Received [facility]   Hunger Vital Sign  Worried About Programme researcher, broadcasting/film/video in the Last Year: Never true    Within the past 12 months, the food you bought just didn't last and you didn't have money to get more: Not on file  Transportation Needs: Unmet Transportation Needs (05/27/2022)   [facility]   Transportation    In the past 12 months, has lack of reliable transportation kept you from medical appointments, meetings, work or from getting things needed for daily living? : Yes  Physical Activity: Not on file  Stress: Not on file  Social Connections: Not on file     Allergies:   Allergies  Allergen Reactions   Tetracycline Swelling    Metabolic Disorder Labs: No results found for: "HGBA1C", "MPG" No results found for: "PROLACTIN" No results found for: "CHOL", "TRIG", "HDL", "CHOLHDL", "VLDL", "LDLCALC" Lab Results  Component Value Date   TSH 11.692 (H) 07/22/2015    Therapeutic Level Labs: No results found for: "LITHIUM" No results found for: "CBMZ" No results found for: "VALPROATE"  Current Medications: Current Outpatient Medications  Medication Sig Dispense Refill   busPIRone (BUSPAR) 7.5 MG tablet TAKE ONE TABLET BY MOUTH TWICE A DAY 60 tablet 1   estradiol (ESTRACE) 0.1 MG/GM vaginal cream Use your finger to place a pea sized  amount at the vaginal opening twice a week 42.5 g 1   ibuprofen (ADVIL) 600 MG tablet Take 1 tablet (600 mg total) by mouth every 6 (six) hours as needed. 30 tablet 0   levothyroxine (SYNTHROID, LEVOTHROID) 75 MCG tablet Take 1 tablet (75 mcg total) by mouth daily. 30 tablet 0   mirtazapine (REMERON) 15 MG tablet TAKE ONE TABLET BY MOUTH AT BEDTIME 30 tablet 1   timolol (TIMOPTIC) 0.5 % ophthalmic solution Place 1 drop into both eyes 2 (two) times daily.  2   zinc gluconate 50 MG tablet Take 50 mg by mouth daily.     No current facility-administered medications for this visit.     Psychiatric Specialty Exam: Review of Systems  Cardiovascular:  Negative for chest pain.  Psychiatric/Behavioral:  Negative for agitation.     There were no vitals taken for this visit.There is no height or weight on file to calculate BMI.  General Appearance: Casual  Eye Contact:  Fair  Speech:  Slow  Volume:  Normal  Mood:  somewhat stressed  Affect:  Constricted  Thought Process:  Goal Directed  Orientation:  Full (Time, Place, and Person)  Thought Content:  Rumination  Suicidal Thoughts:  No  Homicidal Thoughts:  No  Memory:  Immediate;   Fair  Judgement:  Fair  Insight:  Fair  Psychomotor Activity:  Normal  Concentration:  Concentration: Fair  Recall:  [employer] of Knowledge:Fair  Language: Fair  Akathisia:  No  Handed:    AIMS (if indicated):  not done  Assets:  Desire for Improvement Financial Resources/Insurance  ADL's:  Intact  Cognition: WNL  Sleep:  Fair   Screenings: PHQ2-9    Flowsheet Row Office Visit from 06/28/2022 in [facility] at [facility] Patient Outreach Telephone from 09/09/2016 in [facility]  PHQ-2 Total Score 1 0      Flowsheet Row Admission (Discharged) from 04/14/2023 in [place] Office Visit from 06/28/2022 in [facility]  C-SSRS RISK CATEGORY No Risk No  Risk       Assessment and Plan: as follows  Prior documentation reviewed  Major depressive  disorder recurrent severe; fair continue remeron  Generalized anxiety disorder; recurrence of anxiety , free floating and of decisions of home and estate . Increase buspar to tid and continue coping skills She may need to sit down with son in regard to any worry related to her house, its selling or to downsize as this has been in her mind     Insomnia; manageable sometimes wakes early, continue remeron  Fu 2 m.    Collaboration of Care: Primary Care Provider AEB reviewed notes and chart   Patient/Guardian was advised Release of Information must be obtained prior to any record release in order to collaborate their care with an outside provider. Patient/Guardian was advised if they have not already done so to contact the registration department to sign all necessary forms in order for [place] to release information regarding their care.   Consent: Patient/Guardian gives verbal consent for treatment and assignment of benefits for services provided during this visit. Patient/Guardian expressed understanding and agreed to proceed.   Thresa Ross, MD 4/11/202510:03 AM

## 2023-09-03 ENCOUNTER — Telehealth (INDEPENDENT_AMBULATORY_CARE_PROVIDER_SITE_OTHER): Admitting: Psychiatry

## 2023-09-03 ENCOUNTER — Encounter (HOSPITAL_COMMUNITY): Payer: Self-pay | Admitting: Psychiatry

## 2023-09-03 DIAGNOSIS — F331 Major depressive disorder, recurrent, moderate: Secondary | ICD-10-CM | POA: Diagnosis not present

## 2023-09-03 DIAGNOSIS — F411 Generalized anxiety disorder: Secondary | ICD-10-CM | POA: Diagnosis not present

## 2023-09-03 MED ORDER — BUSPIRONE HCL 7.5 MG PO TABS: 7.5000 mg | ORAL_TABLET | Freq: Two times a day (BID) | ORAL | Status: DC

## 2023-09-03 MED ORDER — MIRTAZAPINE 15 MG PO TABS: 15.0000 mg | ORAL_TABLET | Freq: Every day | ORAL | 1 refills | Status: DC

## 2023-09-03 NOTE — Progress Notes (Signed)
 [facility] Follow up visit  Patient Identification: Brianna [name] MRN:  [id number] Date of Evaluation:  09/03/2023 Referral Source: primary care Chief Complaint:   No chief complaint on file. Follow up depression  Visit Diagnosis:    ICD-10-CM   1. MDD (major depressive disorder), recurrent episode, moderate (HCC)  F33.1     2. GAD (generalized anxiety disorder)  F41.1     Virtual Visit via Video Note  I connected with Brianna [name] on 09/03/23 at 10:30 AM EDT by a video enabled telemedicine application and verified that I am speaking with the correct person using two identifiers.  Location: Patient: home Provider: home office   I discussed the limitations of evaluation and management by telemedicine and the availability of in person appointments. The patient expressed understanding and agreed to proceed.         I discussed the assessment and treatment plan with the patient. The patient was provided an opportunity to ask questions and all were answered. The patient agreed with the plan and demonstrated an understanding of the instructions.   The patient was advised to call back or seek an in-person evaluation if the symptoms worsen or if the condition fails to improve as anticipated.  I provided 16 minutes of non-face-to-face time during this encounter.     History of Present Illness: Patient is a 78 years old currently white Caucasian female referred initially by primary care physician and herself to establish care for her depression and anxiety patient currently lives by herself she has a caregiver  On eval doing better, son from [place] has moved and is working . She has company Other son may move to [facility] he has early dementia  In general handling stress and reduced buspar  to bid now    Aggravating factors; trauma incident on last year when her son left her for home she bought a house to live together. Being widow  Modifying factors: has a caregive, family ,daughter,  son  Duration thru her adult life Severity  better  Previous Psychotropic Medications: Yes  Most meds used including ellavil in 1975, lexapro  Substance Abuse History in the last 12 months:  Yes.    Consequences of Substance Abuse: One drink a day . Last used one month ago. Discussed risk of depression, memory  Past Medical History:  Past Medical History:  Diagnosis Date   Allergy    Anemia    Attention deficit disorder with hyperactivity(314.01)    Complication of anesthesia    shaking after sugery in the 1980's   Dementia (HCC)    Depressive disorder, not elsewhere classified    Diabetes mellitus without complication (HCC)    lost weight and no longer has the issue with blood sugars   Dysrhythmia    Esophageal reflux    GERD (gastroesophageal reflux disease)    HLD (hyperlipidemia)    Hyperthyroidism    per patient   Insomnia    Kidney disease    stage 3 per pt   Kidney stones    Lung tumor 2018   Migraine    Neuromuscular disorder (HCC)    Obstructive sleep apnea    Osteoarthritis    Osteoporosis    Other and unspecified hyperlipidemia    Other chronic cystitis    Panic attacks    Posttraumatic stress disorder    followed by Dr Carolynne Citron, psychiatry   Sleep apnea    Stricture and stenosis of esophagus    Temporomandibular joint disorders, unspecified  Thoracic spondylosis without myelopathy    Unspecified hypothyroidism     Past Surgical History:  Procedure Laterality Date   ABDOMINAL HYSTERECTOMY     APPENDECTOMY     CHOLECYSTECTOMY     COLONOSCOPY     COLPOCLEISIS N/A 04/14/2023   Procedure: COLPOCLEISIS;  Surgeon: Arma Lamp, MD;  Location: [facility];  Service: Gynecology;  Laterality: N/A;   CYSTORRHAPHY     "bladder tack"   CYSTOSCOPY N/A 04/14/2023   Procedure: CYSTOSCOPY;  Surgeon: Arma Lamp, MD;  Location: [facility];  Service: Gynecology;  Laterality: N/A;   EYE SURGERY Right    LUMBAR  LAMINECTOMY/DECOMPRESSION MICRODISCECTOMY Right 01/28/2013   Procedure: LUMBAR LAMINECTOMY/DECOMPRESSION MICRODISCECTOMY LUMBAR FOUR-FIVE;  Surgeon: Augustine Blocker, MD;  Location: [facility];  Service: Neurosurgery;  Laterality: Right;   RECTOCELE REPAIR N/A 04/14/2023   Procedure: levator plication with perineorrhaphy;  Surgeon: Arma Lamp, MD;  Location: [facility];  Service: Gynecology;  Laterality: N/A;   TONSILLECTOMY     TRANSFORAMINAL LUMBAR INTERBODY FUSION (TLIF) WITH PEDICLE SCREW FIXATION 1 LEVEL Right 11/25/2013   Procedure: TRANSFORAMINAL LUMBAR INTERBODY FUSION (TLIF) WITH PEDICLE SCREW FIXATION 1 LEVEL;  Surgeon: Augustine Blocker, MD;  Location: [facility];  Service: Neurosurgery;  Laterality: Right;  TRANSFORAMINAL LUMBAR INTERBODY FUSION (TLIF) WITH PEDICLE SCREW FIXATION 1 LEVEL LUMBAR 4-5   TUBAL LIGATION        Family History:  Family History  Problem Relation Age of Onset   Heart attack Father    Heart failure Father    Liver disease Father    Heart disease Father    COPD Mother    Depression Mother    Diabetes Mother    Crohn's disease Son    Colon cancer Neg Hx    Colon polyps Neg Hx    Esophageal cancer Neg Hx    Rectal cancer Neg Hx    Stomach cancer Neg Hx     Social History:   Social History   Socioeconomic History   Marital status: Widowed    Spouse name: Not on file   Number of children: 4   Years of education: Not on file   Highest education level: Not on file  Occupational History   Occupation: retired    Associate Professor: UNEMPLOYED  Tobacco Use   Smoking status: Former    Current packs/day: 0.50    Average packs/day: 0.5 packs/day for 20.0 years (10.0 ttl pk-yrs)    Types: Cigarettes   Smokeless tobacco: Never  Vaping Use   Vaping status: Never Used  Substance and Sexual Activity   Alcohol use: No    Alcohol/week: 0.0 standard drinks of alcohol   Drug use: No   Sexual activity: Not Currently    Birth  control/protection: Surgical  Other Topics Concern   Not on file  Social History Narrative   Pt drinks two cups of coffee daily.  And does praise and worship daily   Social Drivers of Health   Financial Resource Strain: Not on file  Food Insecurity: Low Risk  (08/19/2023)   Received from [facility]   Hunger Vital Sign    Worried About Running Out of Food in the Last Year: Never true    Ran Out of Food in the Last Year: Patient declined to answer  Transportation Needs: No Transportation Needs (08/19/2023)   Received from [facility]    In the past 12 months, has  lack of reliable transportation kept you from medical appointments, meetings, work or from getting things needed for daily living? : No  Physical Activity: Not on file  Stress: Not on file  Social Connections: Not on file     Allergies:   Allergies  Allergen Reactions   Tetracycline Swelling    Metabolic Disorder Labs: No results found for: "HGBA1C", "MPG" No results found for: "PROLACTIN" No results found for: "CHOL", "TRIG", "HDL", "CHOLHDL", "VLDL", "LDLCALC" Lab Results  Component Value Date   TSH 11.692 (H) 07/22/2015    Therapeutic Level Labs: No results found for: "LITHIUM" No results found for: "CBMZ" No results found for: "VALPROATE"  Current Medications: Current Outpatient Medications  Medication Sig Dispense Refill   busPIRone  (BUSPAR ) 7.5 MG tablet Take 1 tablet (7.5 mg total) by mouth 2 (two) times daily.     estradiol  (ESTRACE ) 0.1 MG/GM vaginal cream Use your finger to place a pea sized amount at the vaginal opening twice a week 42.5 g 1   ibuprofen  (ADVIL ) 600 MG tablet Take 1 tablet (600 mg total) by mouth every 6 (six) hours as needed. 30 tablet 0   levothyroxine  (SYNTHROID , LEVOTHROID) 75 MCG tablet Take 1 tablet (75 mcg total) by mouth daily. 30 tablet 0   mirtazapine  (REMERON ) 15 MG tablet Take 1 tablet (15 mg total) by mouth at bedtime. 30 tablet 1   timolol  (TIMOPTIC) 0.5 % ophthalmic solution Place 1 drop into both eyes 2 (two) times daily.  2   zinc gluconate 50 MG tablet Take 50 mg by mouth daily.     No current facility-administered medications for this visit.     Psychiatric Specialty Exam: Review of Systems  Cardiovascular:  Negative for chest pain.  Psychiatric/Behavioral:  Negative for agitation.     There were no vitals taken for this visit.There is no height or weight on file to calculate BMI.  General Appearance: Casual  Eye Contact:  Fair  Speech:  Slow  Volume:  Normal  Mood:  better  Affect:  Constricted  Thought Process:  Goal Directed  Orientation:  Full (Time, Place, and Person)  Thought Content:  Rumination  Suicidal Thoughts:  No  Homicidal Thoughts:  No  Memory:  Immediate;   Fair  Judgement:  Fair  Insight:  Fair  Psychomotor Activity:  Normal  Concentration:  Concentration: Fair  Recall:  [employer] of Knowledge:Fair  Language: Fair  Akathisia:  No  Handed:    AIMS (if indicated):  not done  Assets:  Desire for Improvement Financial Resources/Insurance  ADL's:  Intact  Cognition: WNL  Sleep:  Fair   Screenings: PHQ2-9    Flowsheet Row Office Visit from 06/28/2022 in [facility] at [facility] Patient Outreach Telephone from 09/09/2016 in [facility]  PHQ-2 Total Score 1 0      Flowsheet Row Video Visit from 07/18/2023 in [facility] at [facility] Admission (Discharged) from 04/14/2023 in [place] Office Visit from 06/28/2022 in [facility]  C-SSRS RISK CATEGORY No Risk No Risk No Risk       Assessment and Plan: as follows  Prior documentation reviewed  Major depressive disorder recurrent severe;  fair continue remeron , helps with sleep as well  Generalized anxiety disorder; better continue buspar  now bid    Insomnia; manageable sometimes wakes  early, continue remeron   Fu 3 m.    Collaboration of Care: Primary Care Provider AEB  reviewed notes and chart   Patient/Guardian was advised Release of Information must be obtained prior to any record release in order to collaborate their care with an outside provider. Patient/Guardian was advised if they have not already done so to contact the registration department to sign all necessary forms in order for us  to release information regarding their care.   Consent: Patient/Guardian gives verbal consent for treatment and assignment of benefits for services provided during this visit. Patient/Guardian expressed understanding and agreed to proceed.   Wray Heady, MD 5/28/202510:39 AM

## 2023-09-27 ENCOUNTER — Other Ambulatory Visit (HOSPITAL_COMMUNITY): Payer: Self-pay | Admitting: Psychiatry

## 2023-10-13 NOTE — Telephone Encounter (Signed)
 Dr. Teresa,  Is it ok to place a mammogram order for Brianna [name]?  KC/cma

## 2023-10-13 NOTE — Telephone Encounter (Signed)
 Copied from CRM #[id number]. Topic: Referral - Referrals/Orders [place] >> Oct 13, 2023 11:57 AM Tracee S wrote: Brianna [name], Brianna [name] is calling other request    Include all details related to the request(s) below: Patient is requesting an order for her mammogram. She would like to go to [facility].    Confirm and type the Best Contact Number below:  Patient/caller contact number:   [phone]          [x] Home  [] Mobile  [] Work [] Other   [x] Okay to leave a voicemail   Medication List:  Current Outpatient Medications:  .  acetaminophen  (TYLENOL ) 500 mg tablet, Take 500 mg by mouth., Disp: , Rfl:  .  atorvastatin  (LIPITOR) 40 mg tablet, Take one tablet (40 mg total) by mouth daily., Disp: 90 tablet, Rfl: 1 .  busPIRone  (BUSPAR ) 7.5 mg tablet, Take 7.5 mg by mouth 3 (three) times a day., Disp: , Rfl:  .  levothyroxine  (SYNTHROID ) 75 mcg tablet, Take 1 tablet (75 mcg total) by mouth every morning., Disp: 90 tablet, Rfl: 1 .  mirtazapine  (REMERON ) 15 mg tablet, Take 15 mg by mouth at bedtime., Disp: , Rfl:  .  timolol (TIMOPTIC) 0.5 % ophthalmic solution, INSTILL ONE DROP INTO EACH EYE TWICE DAILY, Disp: 10 mL, Rfl: 3     Medication Request/Refills: Pharmacy Information (if applicable)   [x] Not Applicable       []  Pharmacy listed  Send Medication Request to:                                                 [] Pharmacy not listed (added to pharmacy list in Epic) Send Medication Request to:      Listed Pharmacies: [facility] [address] - [place], [place] - [address] - PHONE: [phone] - FAX: [phone]

## 2023-10-15 NOTE — Telephone Encounter (Signed)
 Patient calling stated that she needs a diagnostic mammogram since she is having pain. Imaging told her to contact pcp for change.

## 2023-10-16 NOTE — Telephone Encounter (Signed)
Done.  Patient is aware.

## 2023-10-21 ENCOUNTER — Encounter: Payer: Self-pay | Admitting: Obstetrics and Gynecology

## 2023-10-21 ENCOUNTER — Ambulatory Visit: Admitting: Obstetrics and Gynecology

## 2023-10-21 ENCOUNTER — Other Ambulatory Visit (HOSPITAL_COMMUNITY)
Admission: RE | Admit: 2023-10-21 | Discharge: 2023-10-21 | Disposition: A | Discharge status: Home / Self Care | Source: Other Acute Inpatient Hospital | Attending: Obstetrics | Admitting: Obstetrics

## 2023-10-21 VITALS — BP 126/81 | HR 82

## 2023-10-21 DIAGNOSIS — N750 Cyst of Bartholin's gland: Secondary | ICD-10-CM | POA: Insufficient documentation

## 2023-10-21 DIAGNOSIS — N390 Urinary tract infection, site not specified: Secondary | ICD-10-CM

## 2023-10-21 DIAGNOSIS — Z87440 Personal history of urinary (tract) infections: Secondary | ICD-10-CM | POA: Diagnosis not present

## 2023-10-21 MED ORDER — ESTRADIOL 0.1 MG/GM VA CREA: TOPICAL_CREAM | VAGINAL | 11 refills | Status: AC

## 2023-10-21 MED ORDER — FOSFOMYCIN TROMETHAMINE 3 G PO PACK: PACK | ORAL | 5 refills | Status: AC

## 2023-10-21 MED ORDER — LIDOCAINE-EPINEPHRINE 1 %-1:100000 IJ SOLN
10.0000 mL | Freq: Once | INTRAMUSCULAR | Status: AC
  Administered 2023-10-21: 5 mL

## 2023-10-21 NOTE — Progress Notes (Signed)
 [facility]  Date of Visit: 10/21/2023  History of Present Illness: Brianna [name] is a 78 y.o. female scheduled today for follow up.  Surgery: s/p Colpocleisis, levator plication, perineorrhaphy, cystoscopy, urethral bulking ([facility]) on 04/13/22  Has had 3 UTIs since the surgery. All E.Coli. Last tested on 7/12. She is currently being treated with macrobid - treated with macrobid  previously.  No urgency/ frequency. No longer using estrace  cream. She is emptying her bladder well. No issues with bowel movements.   Noticed some swelling on the outside of the vagina on the right side. Feels a lot of pressure. Not as tender as it has been. No redness or discharge present.    Medications: She has a current medication list which includes the following prescription(s): atorvastatin , buspirone , estradiol , fosfomycin, ibuprofen , levothyroxine , mirtazapine , nitrofurantoin  (macrocrystal-monohydrate), timolol, and zinc gluconate.   Allergies: Patient is allergic to tetracycline and lactulose.   Physical Exam: BP 126/81   Pulse 82    Pelvic Examination: swelling on right vulva, on palpation 3cm right bartholin cyst noted. On split speculum, normal vaginal tissue  POP-Q: deferred, no prolapse  Bladder scan PVR:  Post Void Residual - 10/21/23 1033       Post Void Residual   Post Void Residual 5 mL         Bartholin I&D procedure:  Verbal consent was obtained. The area was prepped with betadine. 5cc of 1% lidocaine  with epinephrine  injected into the skin over the bartholin cyst. The cyst was incised with a scalpel and a large amount of pus drained. A culture was obtained. A hemostat was inserted into the incision to assist with drainage. The area was irrigated. A word catheter was placed.  ---------------------------------------------------------  Assessment and Plan:  1. Recurrent UTI   2. Bartholin cyst     - Bartholin cyst drained. The word catheter will fall out on its own.  Will have her f/u 2-3 weeks to examine.  - For recurrent UTI, restart vaginal estrogen cream twice a week. Also recommended prophylaxis with fosfomycin 3g  q10 days for 6 months. May also benefit from d-mannose.    Rosaline LOISE Caper, MD

## 2023-10-21 NOTE — Patient Instructions (Signed)
 You had a bartholin abscess drained today. A catheter was placed into the incision to allow it to drain. This will fall out on its own after a few days as it heals. A culture was taken of the fluid. Please call with any concerns

## 2023-10-23 ENCOUNTER — Ambulatory Visit: Payer: Self-pay | Admitting: Obstetrics and Gynecology

## 2023-10-23 DIAGNOSIS — N750 Cyst of Bartholin's gland: Secondary | ICD-10-CM

## 2023-10-23 MED ORDER — CEFIXIME 400 MG PO CAPS: 400.0000 mg | ORAL_CAPSULE | Freq: Every day | ORAL | 0 refills | Status: AC

## 2023-10-26 LAB — AEROBIC/ANAEROBIC CULTURE W GRAM STAIN (SURGICAL/DEEP WOUND)

## 2023-11-07 ENCOUNTER — Ambulatory Visit: Admitting: Obstetrics and Gynecology

## 2023-12-01 ENCOUNTER — Encounter (HOSPITAL_COMMUNITY): Payer: Self-pay | Admitting: Psychiatry

## 2023-12-01 ENCOUNTER — Telehealth (INDEPENDENT_AMBULATORY_CARE_PROVIDER_SITE_OTHER): Admitting: Psychiatry

## 2023-12-01 DIAGNOSIS — F5102 Adjustment insomnia: Secondary | ICD-10-CM

## 2023-12-01 DIAGNOSIS — F331 Major depressive disorder, recurrent, moderate: Secondary | ICD-10-CM | POA: Diagnosis not present

## 2023-12-01 DIAGNOSIS — F411 Generalized anxiety disorder: Secondary | ICD-10-CM | POA: Diagnosis not present

## 2023-12-01 MED ORDER — MIRTAZAPINE 15 MG PO TABS: 15.0000 mg | ORAL_TABLET | Freq: Every day | ORAL | 1 refills | Status: DC

## 2023-12-01 MED ORDER — BUSPIRONE HCL 7.5 MG PO TABS: 7.5000 mg | ORAL_TABLET | Freq: Two times a day (BID) | ORAL | 1 refills | Status: DC

## 2023-12-01 NOTE — Progress Notes (Signed)
 [facility] Follow up visit  Patient Identification: Brianna [name] MRN:  [id number] Date of Evaluation:  12/01/2023 Referral Source: primary care Chief Complaint:   No chief complaint on file. Follow up depression  Visit Diagnosis:    ICD-10-CM   1. MDD (major depressive disorder), recurrent episode, moderate (HCC)  F33.1     2. GAD (generalized anxiety disorder)  F41.1     Virtual Visit via Video Note  I connected with Sukaina A Ellenberger on 12/01/23 at  9:40 AM EDT by a video enabled telemedicine application and verified that I am speaking with the correct person using two identifiers.  Location: Patient: home Provider: home office   I discussed the limitations of evaluation and management by telemedicine and the availability of in person appointments. The patient expressed understanding and agreed to proceed.      I discussed the assessment and treatment plan with the patient. The patient was provided an opportunity to ask questions and all were answered. The patient agreed with the plan and demonstrated an understanding of the instructions.   The patient was advised to call back or seek an in-person evaluation if the symptoms worsen or if the condition fails to improve as anticipated.  I provided 18 minutes of non-face-to-face time during this encounter.           History of Present Illness: Patient is a 78 years old currently white Caucasian female referred initially by primary care physician and herself to establish care for her depression and anxiety patient currently lives by herself she has a caregiver   On evaluation patient doing reasonable she is suffering from cold symptoms other than that she is handling her mental health and tolerating medication she is helping her with her son.  BuSpar  is helping with anxiety and Remeron  for sleep and depression   Aggravating factors; trauma related to her son when he left home couple of years ago. Being widow  Modifying factors:  has a caregive, family  Duration thru her adult life Severity manageable  Previous Psychotropic Medications: Yes  Most meds used including ellavil in 1975, lexapro  Substance Abuse History in the last 12 months:  Yes.    Consequences of Substance Abuse: One drink a day . Last used one month ago. Discussed risk of depression, memory  Past Medical History:  Past Medical History:  Diagnosis Date   Allergy    Anemia    Attention deficit disorder with hyperactivity(314.01)    Complication of anesthesia    shaking after sugery in the 1980's   Dementia (HCC)    Depressive disorder, not elsewhere classified    Diabetes mellitus without complication (HCC)    lost weight and no longer has the issue with blood sugars   Dysrhythmia    Esophageal reflux    GERD (gastroesophageal reflux disease)    HLD (hyperlipidemia)    Hyperthyroidism    per patient   Insomnia    Kidney disease    stage 3 per pt   Kidney stones    Lung tumor 2018   Migraine    Neuromuscular disorder (HCC)    Obstructive sleep apnea    Osteoarthritis    Osteoporosis    Other and unspecified hyperlipidemia    Other chronic cystitis    Panic attacks    Posttraumatic stress disorder    followed by Dr Waddell, psychiatry   Sleep apnea    Stricture and stenosis of esophagus    Temporomandibular joint disorders, unspecified  Thoracic spondylosis without myelopathy    Unspecified hypothyroidism     Past Surgical History:  Procedure Laterality Date   ABDOMINAL HYSTERECTOMY     APPENDECTOMY     CHOLECYSTECTOMY     COLONOSCOPY     COLPOCLEISIS N/A 04/14/2023   Procedure: COLPOCLEISIS;  Surgeon: Marilynne Rosaline SAILOR, MD;  Location: [facility];  Service: Gynecology;  Laterality: N/A;   CYSTORRHAPHY     bladder tack   CYSTOSCOPY N/A 04/14/2023   Procedure: CYSTOSCOPY;  Surgeon: Marilynne Rosaline SAILOR, MD;  Location: [facility];  Service: Gynecology;  Laterality: N/A;   EYE SURGERY  Right    LUMBAR LAMINECTOMY/DECOMPRESSION MICRODISCECTOMY Right 01/28/2013   Procedure: LUMBAR LAMINECTOMY/DECOMPRESSION MICRODISCECTOMY LUMBAR FOUR-FIVE;  Surgeon: Darina MALVA Boehringer, MD;  Location: [facility];  Service: Neurosurgery;  Laterality: Right;   RECTOCELE REPAIR N/A 04/14/2023   Procedure: levator plication with perineorrhaphy;  Surgeon: Marilynne Rosaline SAILOR, MD;  Location: [facility];  Service: Gynecology;  Laterality: N/A;   TONSILLECTOMY     TRANSFORAMINAL LUMBAR INTERBODY FUSION (TLIF) WITH PEDICLE SCREW FIXATION 1 LEVEL Right 11/25/2013   Procedure: TRANSFORAMINAL LUMBAR INTERBODY FUSION (TLIF) WITH PEDICLE SCREW FIXATION 1 LEVEL;  Surgeon: Darina MALVA Boehringer, MD;  Location: [facility];  Service: Neurosurgery;  Laterality: Right;  TRANSFORAMINAL LUMBAR INTERBODY FUSION (TLIF) WITH PEDICLE SCREW FIXATION 1 LEVEL LUMBAR 4-5   TUBAL LIGATION        Family History:  Family History  Problem Relation Age of Onset   Heart attack Father    Heart failure Father    Liver disease Father    Heart disease Father    COPD Mother    Depression Mother    Diabetes Mother    Crohn's disease Son    Colon cancer Neg Hx    Colon polyps Neg Hx    Esophageal cancer Neg Hx    Rectal cancer Neg Hx    Stomach cancer Neg Hx     Social History:   Social History   Socioeconomic History   Marital status: Widowed    Spouse name: Not on file   Number of children: 4   Years of education: Not on file   Highest education level: Not on file  Occupational History   Occupation: retired    Associate Professor: UNEMPLOYED  Tobacco Use   Smoking status: Former    Current packs/day: 0.50    Average packs/day: 0.5 packs/day for 20.0 years (10.0 ttl pk-yrs)    Types: Cigarettes   Smokeless tobacco: Never  Vaping Use   Vaping status: Never Used  Substance and Sexual Activity   Alcohol use: No    Alcohol/week: 0.0 standard drinks of alcohol   Drug use: No   Sexual activity: Not Currently     Birth control/protection: Surgical  Other Topics Concern   Not on file  Social History Narrative   Pt drinks two cups of coffee daily.  And does praise and worship daily   Social Drivers of Health   Financial Resource Strain: Not on file  Food Insecurity: Low Risk  (08/19/2023)   Received from [facility]   Hunger Vital Sign    Within the past 12 months, you worried that your food would run out before you got money to buy more: Never true    Within the past 12 months, the food you bought just didn't last and you didn't have money to get more. : Patient declined to answer  Transportation  Needs: No Transportation Needs (08/19/2023)   Received from [facility]    In the past 12 months, has lack of reliable transportation kept you from medical appointments, meetings, work or from getting things needed for daily living? : No  Physical Activity: Not on file  Stress: Not on file  Social Connections: Not on file     Allergies:   Allergies  Allergen Reactions   Tetracycline Swelling   Lactulose Nausea Only    Metabolic Disorder Labs: No results found for: HGBA1C, MPG No results found for: PROLACTIN No results found for: CHOL, TRIG, HDL, CHOLHDL, VLDL, LDLCALC Lab Results  Component Value Date   TSH 11.692 (H) 07/22/2015    Therapeutic Level Labs: No results found for: LITHIUM No results found for: CBMZ No results found for: VALPROATE  Current Medications: Current Outpatient Medications  Medication Sig Dispense Refill   atorvastatin  (LIPITOR) 40 MG tablet Take 40 mg by mouth.     busPIRone  (BUSPAR ) 7.5 MG tablet Take 1 tablet (7.5 mg total) by mouth 2 (two) times daily. 60 tablet 1   estradiol  (ESTRACE ) 0.1 MG/GM vaginal cream Use your finger to place a pea sized amount at the vaginal opening twice a week 42.5 g 11   fosfomycin (MONUROL ) 3 g PACK Take 1 packet every 10 days. Dissolve packet in 3-4 oz of water  and drink immediately.  3 each 5   ibuprofen  (ADVIL ) 600 MG tablet Take 1 tablet (600 mg total) by mouth every 6 (six) hours as needed. 30 tablet 0   levothyroxine  (SYNTHROID , LEVOTHROID) 75 MCG tablet Take 1 tablet (75 mcg total) by mouth daily. 30 tablet 0   mirtazapine  (REMERON ) 15 MG tablet Take 1 tablet (15 mg total) by mouth at bedtime. 30 tablet 1   timolol (TIMOPTIC) 0.5 % ophthalmic solution Place 1 drop into both eyes 2 (two) times daily.  2   zinc gluconate 50 MG tablet Take 50 mg by mouth daily.     No current facility-administered medications for this visit.     Psychiatric Specialty Exam: Review of Systems  Cardiovascular:  Negative for chest pain.  Psychiatric/Behavioral:  Negative for agitation.     There were no vitals taken for this visit.There is no height or weight on file to calculate BMI.  General Appearance: Casual  Eye Contact:  Fair  Speech:  Slow  Volume:  Normal  Mood: Fair  Affect:  Constricted  Thought Process:  Goal Directed  Orientation:  Full (Time, Place, and Person)  Thought Content:  Rumination  Suicidal Thoughts:  No  Homicidal Thoughts:  No  Memory:  Immediate;   Fair  Judgement:  Fair  Insight:  Fair  Psychomotor Activity:  Normal  Concentration:  Concentration: Fair  Recall:  [employer] of Knowledge:Fair  Language: Fair  Akathisia:  No  Handed:    AIMS (if indicated):  not done  Assets:  Desire for Improvement Financial Resources/Insurance  ADL's:  Intact  Cognition: WNL  Sleep:  Fair   Screenings: [employer] [facility] Office Visit from 06/28/2022 in [facility] Patient Outreach Telephone from 09/09/2016 in [facility]  PHQ-2 Total Score 1 0   Flowsheet Row Video Visit from 07/18/2023 in [facility] Admission (Discharged) from 04/14/2023 in [place] Office Visit from 06/28/2022 in [facility] at  [facility]  C-SSRS RISK CATEGORY No Risk No Risk  No Risk    Assessment and Plan: as follows Prior documentation reviewed  Major depressive disorder recurrent severe; manageable continue Remeron   Generalized anxiety disorder; has some anxiety in relationship to stressors at home with her son other than that managing it fairly continue BuSpar      Insomnia; doing fair continue sleep hygiene and Remeron  at night Medication sent and reviewed Follow-up in 3 to 4 months or earlier if needed   collaboration of Care: Primary Care Provider AEB reviewed notes and chart   Patient/Guardian was advised Release of Information must be obtained prior to any record release in order to collaborate their care with an outside provider. Patient/Guardian was advised if they have not already done so to contact the registration department to sign all necessary forms in order for us  to release information regarding their care.   Consent: Patient/Guardian gives verbal consent for treatment and assignment of benefits for services provided during this visit. Patient/Guardian expressed understanding and agreed to proceed.   Jackey Flight, MD 8/25/20259:48 AM

## 2023-12-03 ENCOUNTER — Telehealth (HOSPITAL_COMMUNITY): Admitting: Psychiatry

## 2024-02-16 ENCOUNTER — Other Ambulatory Visit (HOSPITAL_COMMUNITY): Payer: Self-pay | Admitting: Psychiatry

## 2024-02-23 ENCOUNTER — Encounter (HOSPITAL_COMMUNITY): Payer: Self-pay | Admitting: Psychiatry

## 2024-02-23 ENCOUNTER — Telehealth (INDEPENDENT_AMBULATORY_CARE_PROVIDER_SITE_OTHER): Admitting: Psychiatry

## 2024-02-23 DIAGNOSIS — F5102 Adjustment insomnia: Secondary | ICD-10-CM

## 2024-02-23 DIAGNOSIS — F331 Major depressive disorder, recurrent, moderate: Secondary | ICD-10-CM | POA: Diagnosis not present

## 2024-02-23 DIAGNOSIS — F411 Generalized anxiety disorder: Secondary | ICD-10-CM | POA: Diagnosis not present

## 2024-02-23 MED ORDER — BUSPIRONE HCL 10 MG PO TABS: 10.0000 mg | ORAL_TABLET | Freq: Two times a day (BID) | ORAL | 1 refills | Status: AC

## 2024-02-23 NOTE — Progress Notes (Signed)
 [facility] Follow up visit  Patient Identification: Brianna [name] MRN:  [id number] Date of Evaluation:  02/23/2024 Referral Source: primary care Chief Complaint:   No chief complaint on file. Follow up depression  Visit Diagnosis:    ICD-10-CM   1. MDD (major depressive disorder), recurrent episode, moderate (HCC)  F33.1     2. GAD (generalized anxiety disorder)  F41.1     3. Adjustment insomnia  F51.02     Virtual Visit via Video Note  I connected with [name] on 02/23/24 at  9:40 AM EST by a video enabled telemedicine application and verified that I am speaking with the correct person using two identifiers.  Location: Patient: home Provider: home office   I discussed the limitations of evaluation and management by telemedicine and the availability of in person appointments. The patient expressed understanding and agreed to proceed.     I discussed the assessment and treatment plan with the patient. The patient was provided an opportunity to ask questions and all were answered. The patient agreed with the plan and demonstrated an understanding of the instructions.   The patient was advised to call back or seek an in-person evaluation if the symptoms worsen or if the condition fails to improve as anticipated.  I provided 18 minutes of non-face-to-face time during this encounter.      History of Present Illness: Patient is a 78 years old currently white Caucasian female referred initially by primary care physician and herself to establish care for her depression and anxiety patient currently lives by herself she has a caregiver  On evaluation patient describes to have some better days but then some days she feels lonely or sad.  She believes BuSpar  need to increase that has helped her take anxiety and depression in the past  She continues to take Remeron  that helps her sleep well her son lives with her but he works so there is less communication   Aggravating factors;  trauma related to her son when he left home couple of years ago. Being widow  Modifying factors: has a caregive, family  Duration thru her adult life Severity somewhat stressed previous Psychotropic Medications: Yes  Most meds used including ellavil in 1975, lexapro  Substance Abuse History in the last 12 months:  Yes.    Consequences of Substance Abuse: One drink a day . Last used one month ago. Discussed risk of depression, memory  Past Medical History:  Past Medical History:  Diagnosis Date   Allergy    Anemia    Attention deficit disorder with hyperactivity(314.01)    Complication of anesthesia    shaking after sugery in the 1980's   Dementia (HCC)    Depressive disorder, not elsewhere classified    Diabetes mellitus without complication (HCC)    lost weight and no longer has the issue with blood sugars   Dysrhythmia    Esophageal reflux    GERD (gastroesophageal reflux disease)    HLD (hyperlipidemia)    Hyperthyroidism    per patient   Insomnia    Kidney disease    stage 3 per pt   Kidney stones    Lung tumor 2018   Migraine    Neuromuscular disorder (HCC)    Obstructive sleep apnea    Osteoarthritis    Osteoporosis    Other and unspecified hyperlipidemia    Other chronic cystitis    Panic attacks    Posttraumatic stress disorder    followed by Dr Waddell, psychiatry  Sleep apnea    Stricture and stenosis of esophagus    Temporomandibular joint disorders, unspecified    Thoracic spondylosis without myelopathy    Unspecified hypothyroidism     Past Surgical History:  Procedure Laterality Date   ABDOMINAL HYSTERECTOMY     APPENDECTOMY     CHOLECYSTECTOMY     COLONOSCOPY     COLPOCLEISIS N/A 04/14/2023   Procedure: COLPOCLEISIS;  Surgeon: Marilynne Rosaline SAILOR, MD;  Location: [facility];  Service: Gynecology;  Laterality: N/A;   CYSTORRHAPHY     bladder tack   CYSTOSCOPY N/A 04/14/2023   Procedure: CYSTOSCOPY;  Surgeon: Marilynne Rosaline SAILOR, MD;  Location: [facility];  Service: Gynecology;  Laterality: N/A;   EYE SURGERY Right    LUMBAR LAMINECTOMY/DECOMPRESSION MICRODISCECTOMY Right 01/28/2013   Procedure: LUMBAR LAMINECTOMY/DECOMPRESSION MICRODISCECTOMY LUMBAR FOUR-FIVE;  Surgeon: Darina MALVA Boehringer, MD;  Location: [facility];  Service: Neurosurgery;  Laterality: Right;   RECTOCELE REPAIR N/A 04/14/2023   Procedure: levator plication with perineorrhaphy;  Surgeon: Marilynne Rosaline SAILOR, MD;  Location: [facility];  Service: Gynecology;  Laterality: N/A;   TONSILLECTOMY     TRANSFORAMINAL LUMBAR INTERBODY FUSION (TLIF) WITH PEDICLE SCREW FIXATION 1 LEVEL Right 11/25/2013   Procedure: TRANSFORAMINAL LUMBAR INTERBODY FUSION (TLIF) WITH PEDICLE SCREW FIXATION 1 LEVEL;  Surgeon: Darina MALVA Boehringer, MD;  Location: [facility];  Service: Neurosurgery;  Laterality: Right;  TRANSFORAMINAL LUMBAR INTERBODY FUSION (TLIF) WITH PEDICLE SCREW FIXATION 1 LEVEL LUMBAR 4-5   TUBAL LIGATION        Family History:  Family History  Problem Relation Age of Onset   Heart attack Father    Heart failure Father    Liver disease Father    Heart disease Father    COPD Mother    Depression Mother    Diabetes Mother    Crohn's disease Son    Colon cancer Neg Hx    Colon polyps Neg Hx    Esophageal cancer Neg Hx    Rectal cancer Neg Hx    Stomach cancer Neg Hx     Social History:   Social History   Socioeconomic History   Marital status: Widowed    Spouse name: Not on file   Number of children: 4   Years of education: Not on file   Highest education level: Not on file  Occupational History   Occupation: retired    Associate Professor: UNEMPLOYED  Tobacco Use   Smoking status: Former    Current packs/day: 0.50    Average packs/day: 0.5 packs/day for 20.0 years (10.0 ttl pk-yrs)    Types: Cigarettes   Smokeless tobacco: Never  Vaping Use   Vaping status: Never Used  Substance and Sexual Activity   Alcohol  use: No    Alcohol/week: 0.0 standard drinks of alcohol   Drug use: No   Sexual activity: Not Currently    Birth control/protection: Surgical  Other Topics Concern   Not on file  Social History Narrative   Pt drinks two cups of coffee daily.  And does praise and worship daily   Social Drivers of Health   Financial Resource Strain: Not on file  Food Insecurity: Low Risk  (08/19/2023)   Received from [facility]   Hunger Vital Sign    Within the past 12 months, you worried that your food would run out before you got money to buy more: Never true    Within the past 12 months, the food  you bought just didn't last and you didn't have money to get more. : Patient declined to answer  Transportation Needs: No Transportation Needs (08/19/2023)   Received from [facility]    In the past 12 months, has lack of reliable transportation kept you from medical appointments, meetings, work or from getting things needed for daily living? : No  Physical Activity: Not on file  Stress: Not on file  Social Connections: Not on file     Allergies:   Allergies  Allergen Reactions   Tetracycline Swelling   Lactulose Nausea Only    Metabolic Disorder Labs: No results found for: HGBA1C, MPG No results found for: PROLACTIN No results found for: CHOL, TRIG, HDL, CHOLHDL, VLDL, LDLCALC Lab Results  Component Value Date   TSH 11.692 (H) 07/22/2015    Therapeutic Level Labs: No results found for: LITHIUM No results found for: CBMZ No results found for: VALPROATE  Current Medications: Current Outpatient Medications  Medication Sig Dispense Refill   atorvastatin  (LIPITOR) 40 MG tablet Take 40 mg by mouth.     busPIRone  (BUSPAR ) 10 MG tablet Take 1 tablet (10 mg total) by mouth 2 (two) times daily. 60 tablet 1   estradiol  (ESTRACE ) 0.1 MG/GM vaginal cream Use your finger to place a pea sized amount at the vaginal opening twice a week 42.5 g 11    fosfomycin (MONUROL ) 3 g PACK Take 1 packet every 10 days. Dissolve packet in 3-4 oz of water  and drink immediately. 3 each 5   ibuprofen  (ADVIL ) 600 MG tablet Take 1 tablet (600 mg total) by mouth every 6 (six) hours as needed. 30 tablet 0   levothyroxine  (SYNTHROID , LEVOTHROID) 75 MCG tablet Take 1 tablet (75 mcg total) by mouth daily. 30 tablet 0   mirtazapine  (REMERON ) 15 MG tablet TAKE ONE TABLET BY MOUTH AT BEDTIME 30 tablet 1   timolol (TIMOPTIC) 0.5 % ophthalmic solution Place 1 drop into both eyes 2 (two) times daily.  2   zinc gluconate 50 MG tablet Take 50 mg by mouth daily.     No current facility-administered medications for this visit.     Psychiatric Specialty Exam: Review of Systems  Cardiovascular:  Negative for chest pain.  Psychiatric/Behavioral:  Negative for agitation. The patient is nervous/anxious.     There were no vitals taken for this visit.There is no height or weight on file to calculate BMI.  General Appearance: Casual  Eye Contact:  Fair  Speech:  Slow  Volume:  Normal  Mood: Fair  Affect:  Constricted  Thought Process:  Goal Directed  Orientation:  Full (Time, Place, and Person)  Thought Content:  Rumination  Suicidal Thoughts:  No  Homicidal Thoughts:  No  Memory:  Immediate;   Fair  Judgement:  Fair  Insight:  Fair  Psychomotor Activity:  Normal  Concentration:  Concentration: Fair  Recall:  [employer] of Knowledge:Fair  Language: Fair  Akathisia:  No  Handed:    AIMS (if indicated):  not done  Assets:  Desire for Improvement Financial Resources/Insurance  ADL's:  Intact  Cognition: WNL  Sleep:  Fair   Screenings: [employer] Row Office Visit from 06/28/2022 in [facility] Patient Outreach Telephone from 09/09/2016 in [facility]  PHQ-2 Total Score 1 0   Flowsheet Row Video Visit from 02/23/2024 in [facility] Video Visit from 07/18/2023 in [facility] Admission (Discharged) from 04/14/2023 in [facility]  C-SSRS RISK CATEGORY No Risk No Risk No Risk    Assessment and Plan: as follows Prior documentation reviewed Major depressive disorder recurrent severe; somewhat subdued more so stressed continue Remeron    generalized anxiety disorder; stressed at times will increase BuSpar  to 10 mg 2 times a day.  She is planning to change her home so that she has a flat surface moving outside from her kitchen as there are steps now and that hinders her movement or outdoor activities   Insomnia; manageable continue Remeron  and sleep hygiene   follow-up in 3 to 4 months or earlier if needed   collaboration of Care: Primary Care Provider AEB reviewed notes and chart   Patient/Guardian was advised Release of Information must be obtained prior to any record release in order to collaborate their care with an outside provider. Patient/Guardian was advised if they have not already done so to contact the registration department to sign all necessary forms in order for us  to release information regarding their care.   Consent: Patient/Guardian gives verbal consent for treatment and assignment of benefits for services provided during this visit. Patient/Guardian expressed understanding and agreed to proceed.   Jackey Flight, MD 11/17/20259:57 AM

## 2024-04-19 ENCOUNTER — Encounter: Payer: Self-pay | Admitting: *Deleted

## 2024-04-21 ENCOUNTER — Other Ambulatory Visit (HOSPITAL_COMMUNITY): Payer: Self-pay | Admitting: Psychiatry

## 2024-05-24 ENCOUNTER — Telehealth (HOSPITAL_COMMUNITY): Payer: Self-pay | Admitting: Psychiatry
